# Patient Record
Sex: Male | Born: 1980
Health system: Southern US, Community
[De-identification: ages and names within clinical notes are randomized; demographics above are authoritative.]

---

## 2019-10-11 ENCOUNTER — Ambulatory Visit (INDEPENDENT_AMBULATORY_CARE_PROVIDER_SITE_OTHER): Payer: BC Managed Care – PPO | Admitting: Sports Medicine

## 2019-10-11 ENCOUNTER — Ambulatory Visit (INDEPENDENT_AMBULATORY_CARE_PROVIDER_SITE_OTHER): Payer: BC Managed Care – PPO

## 2019-10-11 ENCOUNTER — Encounter: Payer: Self-pay | Admitting: Sports Medicine

## 2019-10-11 ENCOUNTER — Other Ambulatory Visit: Payer: Self-pay

## 2019-10-11 DIAGNOSIS — M5416 Radiculopathy, lumbar region: Secondary | ICD-10-CM | POA: Diagnosis not present

## 2019-10-11 MED ORDER — KETOROLAC TROMETHAMINE 60 MG/2ML IM SOLN
60.0000 mg | Freq: Once | INTRAMUSCULAR | Status: AC
  Administered 2019-10-11: 60 mg via INTRAMUSCULAR

## 2019-10-11 MED ORDER — PREDNISONE 50 MG PO TABS: ORAL_TABLET | ORAL | 0 refills | Status: DC

## 2019-10-11 MED ORDER — CYCLOBENZAPRINE HCL 10 MG PO TABS: ORAL_TABLET | ORAL | 0 refills | Status: DC

## 2019-10-11 MED ORDER — IBUPROFEN 800 MG PO TABS: 800.0000 mg | ORAL_TABLET | Freq: Three times a day (TID) | ORAL | 2 refills | Status: DC | PRN

## 2019-10-11 MED ORDER — HYDROCODONE-ACETAMINOPHEN 10-325 MG PO TABS: 1.0000 | ORAL_TABLET | Freq: Three times a day (TID) | ORAL | 0 refills | Status: DC | PRN

## 2019-10-11 NOTE — Addendum Note (Signed)
Addended by: Monica Becton on: 10/11/2019 02:16 PM   Modules accepted: Orders

## 2019-10-11 NOTE — Assessment & Plan Note (Signed)
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 No bowel or bladder dysfunction, saddle numbness, progressive weakness. This is consistent with lumbar radiculitis with axial discogenic pain as well. We discussed the anatomy as well as evolutionary anthropology of degenerative disc disease. He got a shot of Toradol intramuscular today as he was having significant pain. Adding prednisone, ibuprofen prescription strength, low-dose of hydrocodone, formal physical therapy and x-rays. Return to see me in 1 month.

## 2019-10-11 NOTE — Addendum Note (Signed)
Addended by: Ardyth Man on: 10/11/2019 02:35 PM   Modules accepted: Orders

## 2019-10-11 NOTE — Progress Notes (Signed)
    Procedures performed today:    None.  Independent interpretation of notes and tests performed by another provider:   None.  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 No bowel or bladder dysfunction, saddle numbness, progressive weakness. This is consistent with lumbar radiculitis with axial discogenic pain as well. We discussed the anatomy as well as evolutionary anthropology of degenerative disc disease. He got a shot of Toradol intramuscular today as he was having significant pain. Adding prednisone, ibuprofen prescription strength, low-dose of hydrocodone, formal physical therapy and x-rays. Return to see me in 1 month.    ___________________________________________ Ihor Austin. Benjamin Stain, M.D., ABFM., CAQSM. Primary Care and Sports Medicine [facility]  Adjunct Instructor of Family Medicine  [employer]

## 2019-10-26 ENCOUNTER — Ambulatory Visit: Payer: BC Managed Care – PPO | Admitting: Rehabilitative and Restorative Service Providers"

## 2019-11-08 ENCOUNTER — Other Ambulatory Visit: Payer: Self-pay

## 2019-11-08 ENCOUNTER — Ambulatory Visit (INDEPENDENT_AMBULATORY_CARE_PROVIDER_SITE_OTHER): Payer: BC Managed Care – PPO | Admitting: Sports Medicine

## 2019-11-08 ENCOUNTER — Encounter: Payer: Self-pay | Admitting: Sports Medicine

## 2019-11-08 DIAGNOSIS — M5416 Radiculopathy, lumbar region: Secondary | ICD-10-CM | POA: Diagnosis not present

## 2019-11-08 MED ORDER — MELOXICAM 15 MG PO TABS: ORAL_TABLET | ORAL | 3 refills | Status: DC

## 2019-11-08 MED ORDER — HYDROCODONE-ACETAMINOPHEN 10-325 MG PO TABS: 1.0000 | ORAL_TABLET | Freq: Three times a day (TID) | ORAL | 0 refills | Status: DC | PRN

## 2019-11-08 NOTE — Progress Notes (Signed)
    Procedures performed today:    None.  Independent interpretation of notes and tests performed by another provider:   None.  Brief History, Exam, Impression, and Recommendations:    Left lumbar radiculitis This pleasant 39 year old male returns, he had left lumbar radiculitis, x-rays at the time showed L5-S1 DDD. We treated him conservatively and symptoms improved considerably, still has occasional paresthesias down the left leg, and occasional axial pain, we discussed activity modification and lifestyle habits that can decrease this. At this point symptoms are not severe enough to proceed with MRI or epidural injection or surgical referral. Switching from ibuprofen to meloxicam to be taken as needed, return to see me as needed, he will establish care with Dr. Everrett Coombe.    ___________________________________________ Ihor Austin. Benjamin Stain, M.D., ABFM., CAQSM. Primary Care and Sports Medicine [facility]  Adjunct Instructor of Family Medicine  [employer]

## 2019-11-08 NOTE — Assessment & Plan Note (Signed)
This pleasant 39 year old male returns, he had left lumbar radiculitis, x-rays at the time showed L5-S1 DDD. We treated him conservatively and symptoms improved considerably, still has occasional paresthesias down the left leg, and occasional axial pain, we discussed activity modification and lifestyle habits that can decrease this. At this point symptoms are not severe enough to proceed with MRI or epidural injection or surgical referral. Switching from ibuprofen to meloxicam to be taken as needed, return to see me as needed, he will establish care with Dr. Everrett Coombe.

## 2020-03-25 IMAGING — DX DG LUMBAR SPINE COMPLETE 4+V
5 series · 5 of 5 positions shown · non-contrast
Comparison: None.

CLINICAL DATA: Lower back pain.

EXAM:
LUMBAR SPINE - COMPLETE 4+ VIEW

[l-spine ap]
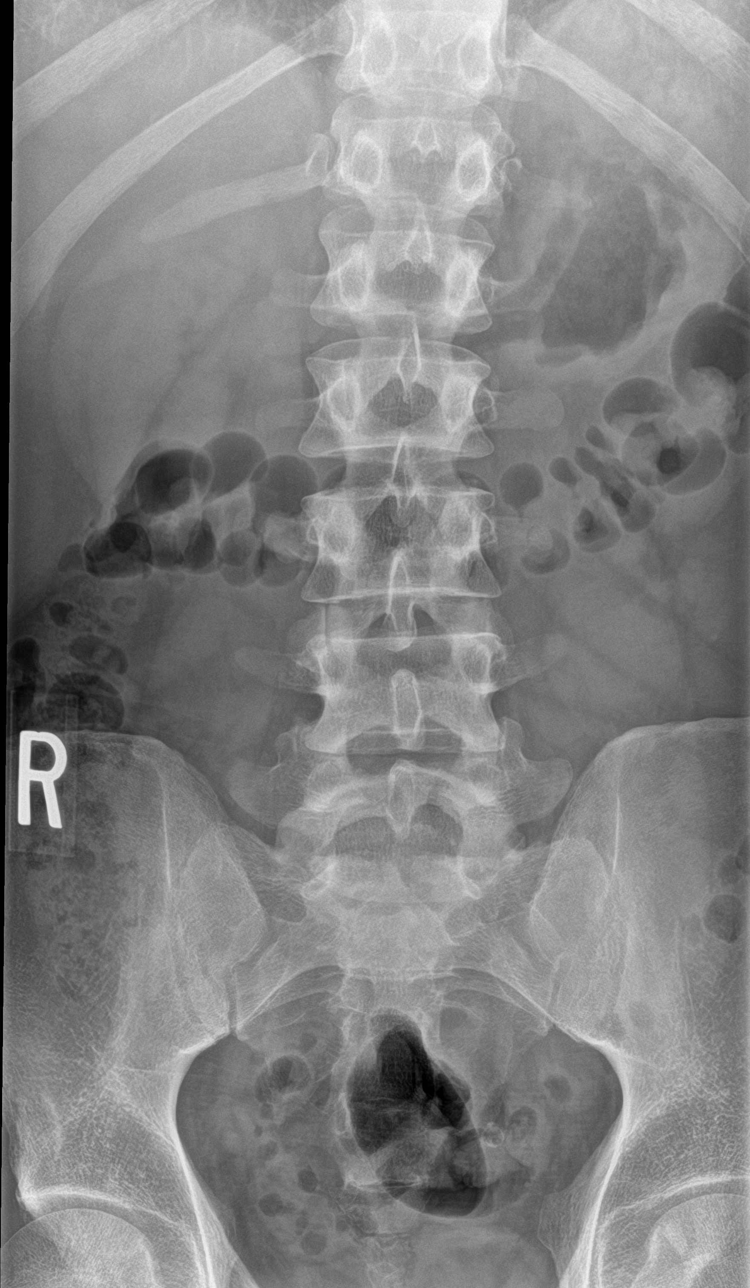

[l-spine obl (1 of 2)]
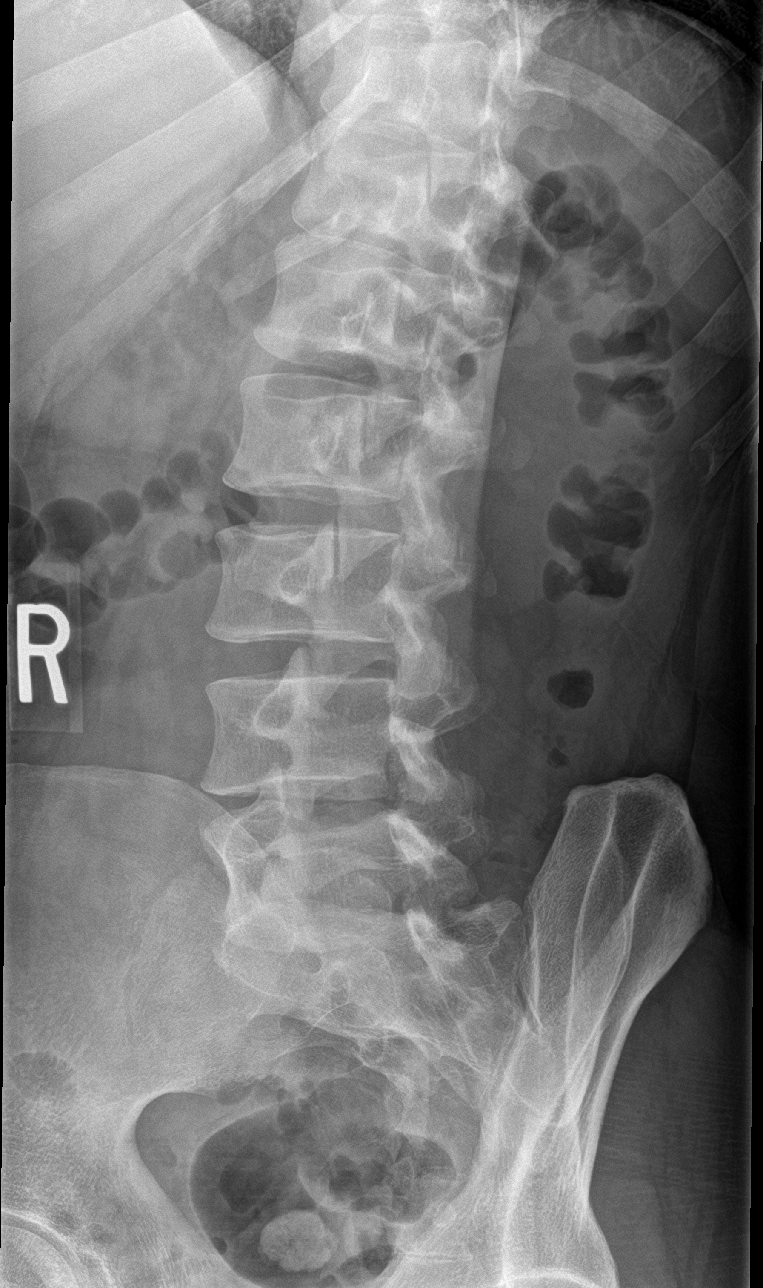

[l-spine obl (2 of 2)]
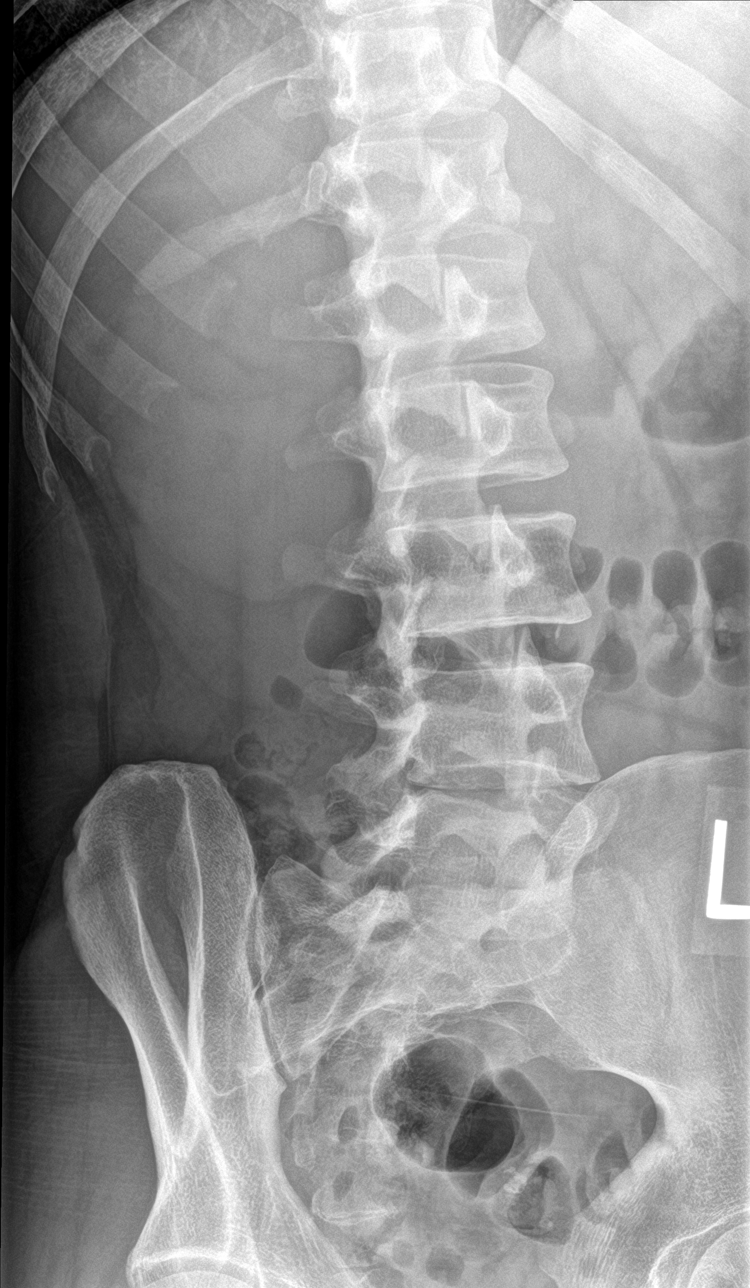

[l-spine lat]
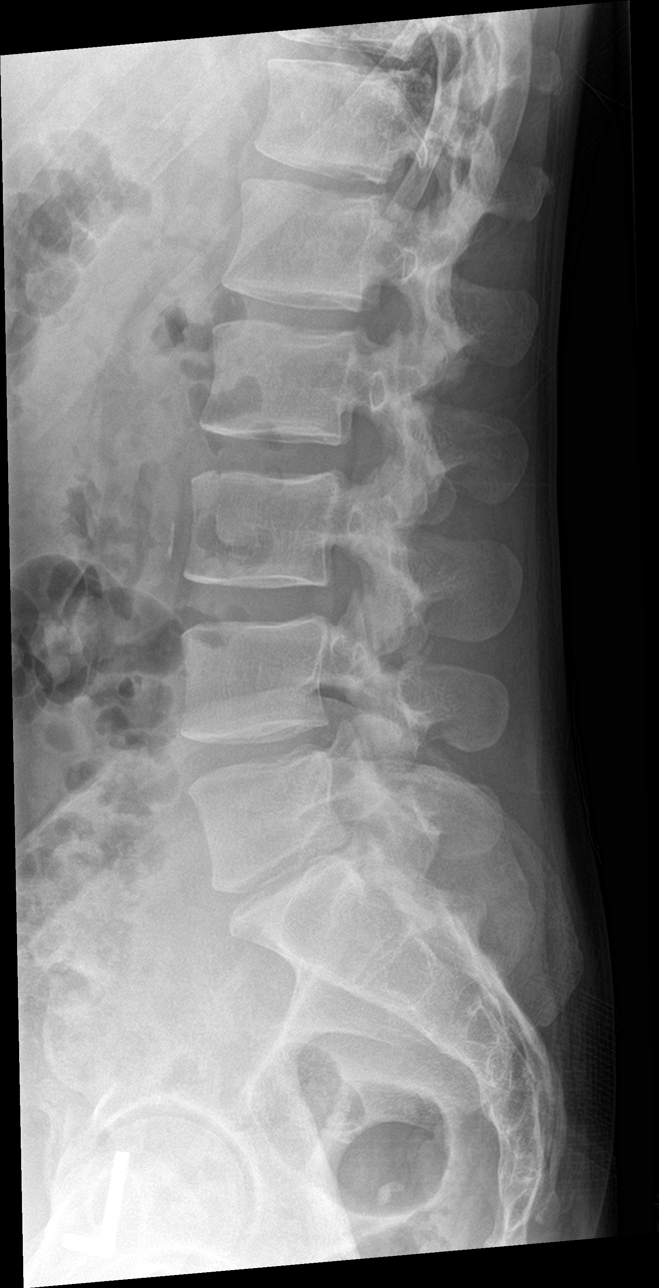

[l-spine spot]
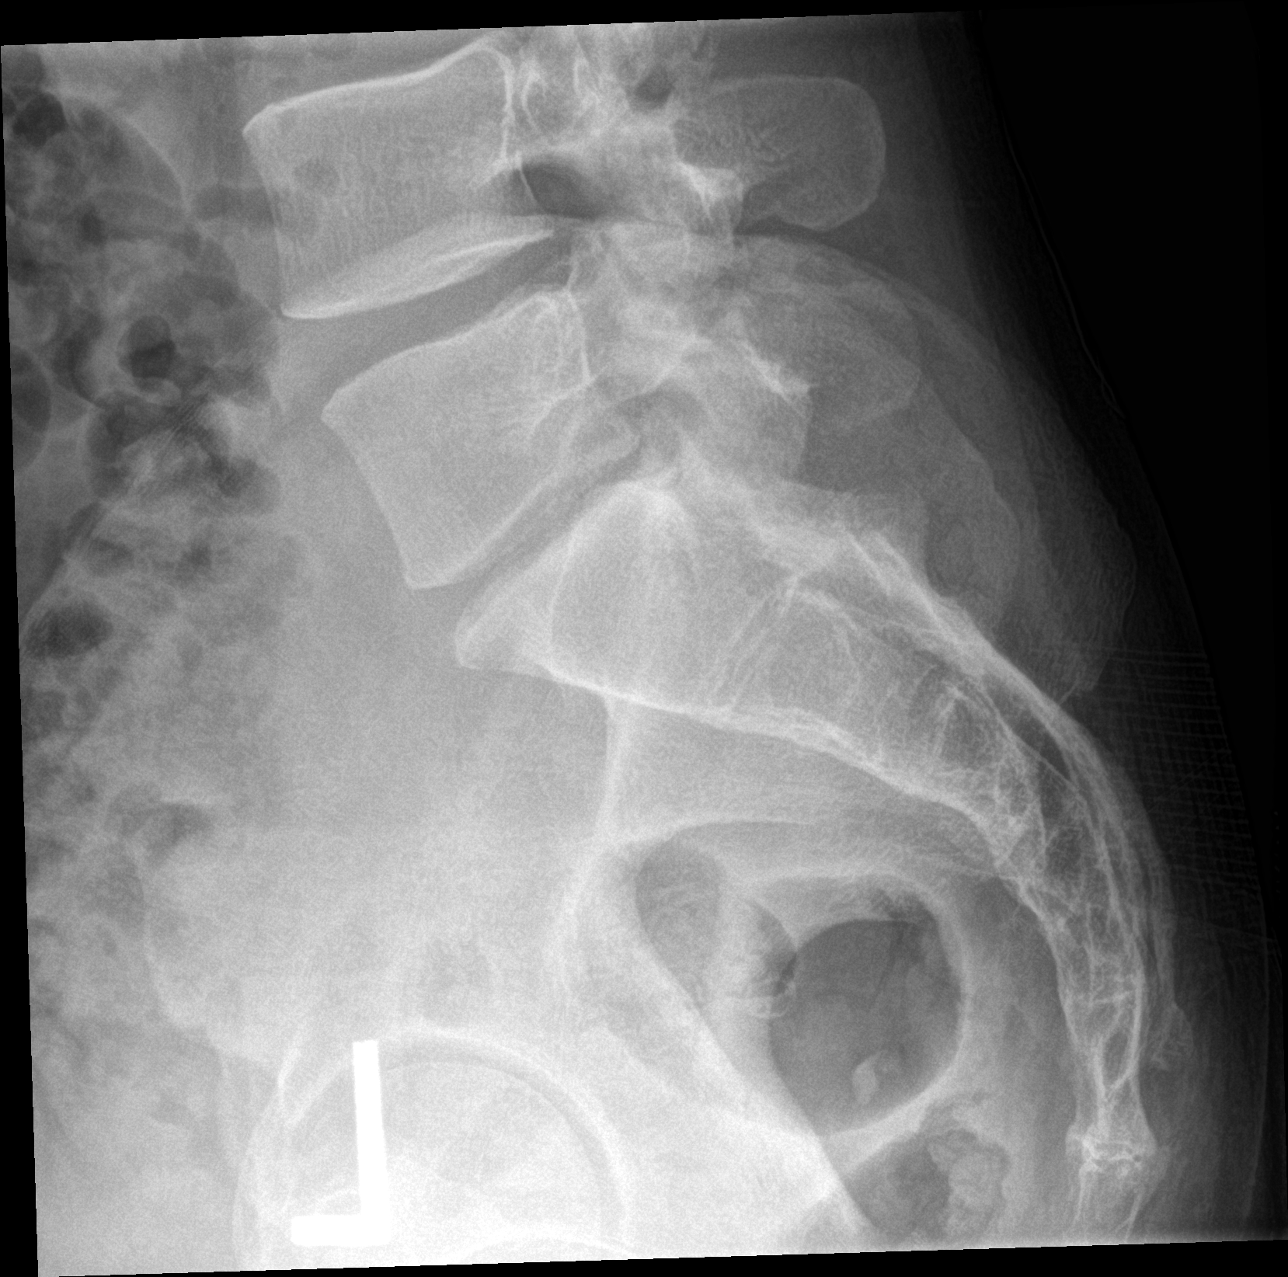

[5 of 5 positions shown; findings below may reference images not displayed]

FINDINGS: There is no evidence of lumbar spine fracture. Alignment is normal.
Normal endplates are seen throughout the lumbar spine. Mild
intervertebral disc space narrowing is seen at the level of L5-S1.
IMPRESSION: Mild intervertebral disc space narrowing at L5-S1.

## 2020-07-22 DIAGNOSIS — Z8616 Personal history of COVID-19: Secondary | ICD-10-CM

## 2020-07-22 HISTORY — DX: Personal history of COVID-19: Z86.16

## 2020-10-27 ENCOUNTER — Encounter: Payer: Self-pay | Admitting: Family Medicine

## 2020-10-27 ENCOUNTER — Other Ambulatory Visit: Payer: Self-pay

## 2020-10-27 ENCOUNTER — Ambulatory Visit: Payer: BC Managed Care – PPO | Admitting: Family Medicine

## 2020-10-27 DIAGNOSIS — J309 Allergic rhinitis, unspecified: Secondary | ICD-10-CM | POA: Diagnosis not present

## 2020-10-27 DIAGNOSIS — F17210 Nicotine dependence, cigarettes, uncomplicated: Secondary | ICD-10-CM

## 2020-10-27 DIAGNOSIS — M5416 Radiculopathy, lumbar region: Secondary | ICD-10-CM | POA: Diagnosis not present

## 2020-10-27 DIAGNOSIS — F411 Generalized anxiety disorder: Secondary | ICD-10-CM | POA: Diagnosis not present

## 2020-10-27 MED ORDER — MELOXICAM 15 MG PO TABS: ORAL_TABLET | ORAL | 3 refills | Status: DC

## 2020-10-27 NOTE — Patient Instructions (Signed)
Great to meet you!  Try adding cetirizine and flonase daily for sinus issues.   Let me know if you would like to try Chantix to quit smoking.   If you decide you want to try medication or counseling for anxiety let me know.

## 2020-10-29 ENCOUNTER — Encounter: Payer: Self-pay | Admitting: Family Medicine

## 2020-10-29 DIAGNOSIS — J309 Allergic rhinitis, unspecified: Secondary | ICD-10-CM | POA: Insufficient documentation

## 2020-10-29 DIAGNOSIS — F411 Generalized anxiety disorder: Secondary | ICD-10-CM | POA: Insufficient documentation

## 2020-10-29 DIAGNOSIS — F17200 Nicotine dependence, unspecified, uncomplicated: Secondary | ICD-10-CM | POA: Insufficient documentation

## 2020-10-29 NOTE — Assessment & Plan Note (Signed)
Discussed options for management including medication, counseling, app based relaxation programs.  He will let me know if he decides to pursue any of these.

## 2020-10-29 NOTE — Assessment & Plan Note (Signed)
Counseled on smoking cessation. He will let me know if interested in Chatix.  Side effects and blackbox warning discussed.

## 2020-10-29 NOTE — Progress Notes (Signed)
Manuel Weaver - 40 y.o. male MRN [id number]  Date of birth: 07-09-1981  Subjective Chief Complaint  Patient presents with  . Establish Care    HPI CLEARENCE [name] is a 40 y.o. male here today for initial visit.  He has been in pretty good health as far as he knows but hasn't had a regular primary care provider in several years.   He has been seeing Dr. Benjamin Stain for lumbar radiculitis previously.  He is taking meloxicam as needed for this.  He is requesting a refill of this today.    He is a current smoker and is interested in quitting.  He tried patches in the past without much luck.  He may be intertested in trying chantix.    He has had some issues with sinus congestion and ear pressure.  He has not tried anything so far for this.  He denies sins pain or fever.   He has had some issues with anxiety.  He isn't really interested in medication for this. He is unsure if he wants to pursue counseling at this time.    Depression screen [facility] 2/9 10/27/2020  Decreased Interest 0  Down, Depressed, Hopeless 0  PHQ - 2 Score 0  Altered sleeping 0  Tired, decreased energy 1  Change in appetite 1  Feeling bad or failure about yourself  0  Trouble concentrating 0  Moving slowly or fidgety/restless 0  Suicidal thoughts 0  PHQ-9 Score 2   GAD 7 : Generalized Anxiety Score 10/27/2020  Nervous, Anxious, on Edge 3  Control/stop worrying 3  Worry too much - different things 3  Trouble relaxing 3  Restless 3  Easily annoyed or irritable 3  Afraid - awful might happen 3  Total GAD 7 Score 21  Anxiety Difficulty Somewhat difficult     ROS:  A comprehensive ROS was completed and negative except as noted per HPI  No Known Allergies  Past Medical History:  Diagnosis Date  . History of COVID-19 07/22/2020    History reviewed. No pertinent surgical history.  Social History   Socioeconomic History  . Marital status: Married    Spouse name: Not on file  . Number of  children: 1  . Years of education: Not on file  . Highest education level: Not on file  Occupational History  . Not on file  Tobacco Use  . Smoking status: Current Every Day Smoker    Packs/day: 0.75    Years: 15.00    Pack years: 11.25    Types: Cigarettes  . Smokeless tobacco: Never Used  Vaping Use  . Vaping Use: Never used  Substance and Sexual Activity  . Alcohol use: Yes    Comment: socially  . Drug use: Never  . Sexual activity: Yes    Partners: Female  Other Topics Concern  . Not on file  Social History Narrative  . Not on file   Social Determinants of Health   Financial Resource Strain: Not on file  Food Insecurity: Not on file  Transportation Needs: Not on file  Physical Activity: Not on file  Stress: Not on file  Social Connections: Not on file    Family History  Problem Relation Age of Onset  . Skin cancer Mother   . Hypertension Father   . Stroke Maternal Grandfather     Health Maintenance  Topic Date Due  . Hepatitis C Screening  Never done  . HIV Screening  Never done  . COVID-19 Vaccine (  3 - Booster for Moderna series) 03/22/2021 (Originally 04/25/2020)  . INFLUENZA VACCINE  02/19/2021  . TETANUS/TDAP  12/21/2027  . HPV VACCINES  Aged Out     ----------------------------------------------------------------------------------------------------------------------------------------------------------------------------------------------------------------- Physical Exam BP (!) 132/93 (BP Location: Left Arm, Patient Position: Sitting, Cuff Size: Normal)   Pulse 80   Temp 97.7 F (36.5 C)   Ht 5\' 11"  (1.803 m)   Wt 174 lb 12.8 oz (79.3 kg)   SpO2 100%   BMI 24.38 kg/m   Physical Exam Constitutional:      Appearance: Normal appearance.  HENT:     Head: Normocephalic and atraumatic.     Right Ear: Tympanic membrane and ear canal normal.     Left Ear: Tympanic membrane and ear canal normal.  Eyes:     General: No scleral  icterus. Cardiovascular:     Rate and Rhythm: Normal rate and regular rhythm.  Pulmonary:     Effort: Pulmonary effort is normal.     Breath sounds: Normal breath sounds.  Musculoskeletal:     Cervical back: Neck supple.  Neurological:     General: No focal deficit present.     Mental Status: He is alert.  Psychiatric:        Mood and Affect: Mood normal.        Behavior: Behavior normal.     ------------------------------------------------------------------------------------------------------------------------------------------------------------------------------------------------------------------- Assessment and Plan  Allergic rhinitis He can try combination of antihistamine and flonase to help with symptoms.  He does have some mild eustachian tube dysfunction as well and I think management of his allergies may help some with this as well.   GAD (generalized anxiety disorder) Discussed options for management including medication, counseling, app based relaxation programs.  He will let me know if he decides to pursue any of these.    Left lumbar radiculitis Rx for meloxicam renewed.    Nicotine dependence Counseled on smoking cessation. He will let me know if interested in Chatix.  Side effects and blackbox warning discussed.     Meds ordered this encounter  Medications  . meloxicam (MOBIC) 15 MG tablet    Sig: One tab PO qAM with a meal for 2 weeks, then daily prn pain.    Dispense:  30 tablet    Refill:  3    No follow-ups on file.    This visit occurred during the SARS-CoV-2 public health emergency.  Safety protocols were in place, including screening questions prior to the visit, additional usage of staff PPE, and extensive cleaning of exam room while observing appropriate contact time as indicated for disinfecting solutions.

## 2020-10-29 NOTE — Assessment & Plan Note (Signed)
He can try combination of antihistamine and flonase to help with symptoms.  He does have some mild eustachian tube dysfunction as well and I think management of his allergies may help some with this as well.

## 2020-10-29 NOTE — Assessment & Plan Note (Signed)
Rx for meloxicam renewed.

## 2020-11-15 ENCOUNTER — Encounter: Payer: Self-pay | Admitting: Family Medicine

## 2020-11-15 ENCOUNTER — Other Ambulatory Visit: Payer: Self-pay

## 2020-11-15 ENCOUNTER — Ambulatory Visit (INDEPENDENT_AMBULATORY_CARE_PROVIDER_SITE_OTHER): Payer: BC Managed Care – PPO | Admitting: Family Medicine

## 2020-11-15 VITALS — BP 121/80 | HR 121 | Temp 97.6°F | Ht 71.0 in | Wt 174.4 lb

## 2020-11-15 DIAGNOSIS — Z0000 Encounter for general adult medical examination without abnormal findings: Secondary | ICD-10-CM | POA: Diagnosis not present

## 2020-11-15 DIAGNOSIS — Z13220 Encounter for screening for lipoid disorders: Secondary | ICD-10-CM | POA: Diagnosis not present

## 2020-11-15 DIAGNOSIS — M5416 Radiculopathy, lumbar region: Secondary | ICD-10-CM

## 2020-11-15 DIAGNOSIS — F17210 Nicotine dependence, cigarettes, uncomplicated: Secondary | ICD-10-CM | POA: Diagnosis not present

## 2020-11-15 MED ORDER — HYDROCODONE-ACETAMINOPHEN 10-325 MG PO TABS: 1.0000 | ORAL_TABLET | Freq: Three times a day (TID) | ORAL | 0 refills | Status: DC | PRN

## 2020-11-15 MED ORDER — CHANTIX STARTING MONTH PAK 0.5 MG X 11 & 1 MG X 42 PO TABS: ORAL_TABLET | ORAL | 0 refills | Status: DC

## 2020-11-15 MED ORDER — MELOXICAM 15 MG PO TABS
15.0000 mg | ORAL_TABLET | Freq: Every day | ORAL | 3 refills | Status: DC | PRN
Start: 2020-11-15 — End: 2021-12-07

## 2020-11-15 NOTE — Progress Notes (Signed)
Manuel Weaver - 40 y.o. male MRN [id number]  Date of birth: 03-11-1981  Subjective Chief Complaint  Patient presents with  . Annual Exam    HPI Manuel Weaver is  40 y.o. male here today for annual exam.    He is interested in quitting smoking.  He has tried patches in the past without success.  He would like to try Chantix.    He would like refill of meloxicam and a few hydrocodone to keep on hand for lumbar radiculopathy.    He continues to have some anxiety but remains undecided about starting medication or seeing a therapist for this.   He feels like diet isn't great.  He typically does not eat during the day and is only eating in the evening.  He does drink quite a bit of coffee.    He is up to date on Tetanus vaccine.   Review of Systems  Constitutional: Negative for chills, fever, malaise/fatigue and weight loss.  HENT: Negative for congestion, ear pain and sore throat.   Eyes: Negative for blurred vision, double vision and pain.  Respiratory: Negative for cough and shortness of breath.   Cardiovascular: Negative for chest pain and palpitations.  Gastrointestinal: Negative for abdominal pain, blood in stool, constipation, heartburn and nausea.  Genitourinary: Negative for dysuria and urgency.  Musculoskeletal: Negative for joint pain and myalgias.  Neurological: Negative for dizziness and headaches.  Endo/Heme/Allergies: Does not bruise/bleed easily.  Psychiatric/Behavioral: Negative for depression. The patient is not nervous/anxious and does not have insomnia.     No Known Allergies  Past Medical History:  Diagnosis Date  . History of COVID-19 07/22/2020    History reviewed. No pertinent surgical history.  Social History   Socioeconomic History  . Marital status: Married    Spouse name: Not on file  . Number of children: 1  . Years of education: Not on file  . Highest education level: Not on file  Occupational History  . Not on file   Tobacco Use  . Smoking status: Current Every Day Smoker    Packs/day: 0.75    Years: 15.00    Pack years: 11.25    Types: Cigarettes  . Smokeless tobacco: Never Used  Vaping Use  . Vaping Use: Never used  Substance and Sexual Activity  . Alcohol use: Yes    Comment: socially  . Drug use: Never  . Sexual activity: Yes    Partners: Female  Other Topics Concern  . Not on file  Social History Narrative  . Not on file   Social Determinants of Health   Financial Resource Strain: Not on file  Food Insecurity: Not on file  Transportation Needs: Not on file  Physical Activity: Not on file  Stress: Not on file  Social Connections: Not on file    Family History  Problem Relation Age of Onset  . Skin cancer Mother   . Hypertension Father   . Stroke Maternal Grandfather     Health Maintenance  Topic Date Due  . Hepatitis C Screening  Never done  . HIV Screening  Never done  . COVID-19 Vaccine (3 - Booster for Moderna series) 03/22/2021 (Originally 04/25/2020)  . INFLUENZA VACCINE  02/19/2021  . TETANUS/TDAP  12/21/2027  . HPV VACCINES  Aged Out     ----------------------------------------------------------------------------------------------------------------------------------------------------------------------------------------------------------------- Physical Exam BP 121/80 (BP Location: Left Arm, Patient Position: Sitting, Cuff Size: Normal)   Pulse (!) 121   Temp 97.6 F (36.4 C)   Ht 5'  11" (1.803 m)   Wt 174 lb 6.4 oz (79.1 kg)   SpO2 100%   BMI 24.32 kg/m   Physical Exam Constitutional:      General: He is not in acute distress. HENT:     Head: Normocephalic and atraumatic.     Right Ear: Tympanic membrane and external ear normal.     Left Ear: Tympanic membrane and external ear normal.  Eyes:     General: No scleral icterus. Neck:     Thyroid: No thyromegaly.  Cardiovascular:     Rate and Rhythm: Normal rate and regular rhythm.     Heart sounds:  Normal heart sounds.  Pulmonary:     Effort: Pulmonary effort is normal.     Breath sounds: Normal breath sounds.  Abdominal:     General: Bowel sounds are normal. There is no distension.     Palpations: Abdomen is soft.     Tenderness: There is no abdominal tenderness. There is no guarding.  Musculoskeletal:     Cervical back: Normal range of motion.  Lymphadenopathy:     Cervical: No cervical adenopathy.  Skin:    General: Skin is warm and dry.     Findings: No rash.  Neurological:     Mental Status: He is alert and oriented to person, place, and time.     Cranial Nerves: No cranial nerve deficit.     Motor: No abnormal muscle tone.  Psychiatric:        Behavior: Behavior normal.     ------------------------------------------------------------------------------------------------------------------------------------------------------------------------------------------------------------------- Assessment and Plan  Nicotine dependence Start chantix, side effects and boxed warnings discussed and he expresses understanding.    Left lumbar radiculitis Meloxicam renewed. #15 norco provided as needed for sever pain. PDMP reviewed.  If needing more than this we'll need to get him on an Opoid contract and check UDS.    Well adult exam Well adult Orders Placed This Encounter  Procedures  . COMPLETE METABOLIC PANEL WITH GFR  . CBC with Differential  . Lipid Panel w/reflex Direct LDL  Screening: lipid panel Immunizations: UTD Anticipatory guidance/Risk factor reduction:  Recommendations per AVS.  Working on quitting smoking.    Meds ordered this encounter  Medications  . meloxicam (MOBIC) 15 MG tablet    Sig: Take 1 tablet (15 mg total) by mouth daily as needed for pain. One tab PO qAM with a meal for 2 weeks, then daily prn pain.    Dispense:  30 tablet    Refill:  3  . HYDROcodone-acetaminophen (NORCO) 10-325 MG tablet    Sig: Take 1 tablet by mouth every 8 (eight) hours  as needed.    Dispense:  15 tablet    Refill:  0  . varenicline (CHANTIX STARTING MONTH PAK) 0.5 MG X 11 & 1 MG X 42 tablet    Sig: Take one 0.5 mg tablet by mouth once daily for 3 days, then increase to one 0.5 mg tablet twice daily for 4 days, then increase to one 1 mg tablet twice daily.    Dispense:  53 tablet    Refill:  0    No follow-ups on file.    This visit occurred during the SARS-CoV-2 public health emergency.  Safety protocols were in place, including screening questions prior to the visit, additional usage of staff PPE, and extensive cleaning of exam room while observing appropriate contact time as indicated for disinfecting solutions.

## 2020-11-15 NOTE — Assessment & Plan Note (Signed)
Meloxicam renewed. #15 norco provided as needed for sever pain. PDMP reviewed.  If needing more than this we'll need to get him on an Opoid contract and check UDS.

## 2020-11-15 NOTE — Assessment & Plan Note (Signed)
Well adult Orders Placed This Encounter  Procedures  . COMPLETE METABOLIC PANEL WITH GFR  . CBC with Differential  . Lipid Panel w/reflex Direct LDL  Screening: lipid panel Immunizations: UTD Anticipatory guidance/Risk factor reduction:  Recommendations per AVS.  Working on quitting smoking.

## 2020-11-15 NOTE — Assessment & Plan Note (Signed)
Start chantix, side effects and boxed warnings discussed and he expresses understanding.

## 2020-11-15 NOTE — Patient Instructions (Signed)

## 2020-11-16 LAB — CBC WITH DIFFERENTIAL/PLATELET
Absolute Monocytes: 494 cells/uL (ref 200–950)
Basophils Absolute: 67 cells/uL (ref 0–200)
Basophils Relative: 0.7 %
Eosinophils Absolute: 371 cells/uL (ref 15–500)
Eosinophils Relative: 3.9 %
HCT: 50.8 % — ABNORMAL HIGH (ref 38.5–50.0)
Hemoglobin: 17.7 g/dL — ABNORMAL HIGH (ref 13.2–17.1)
Lymphs Abs: 2138 cells/uL (ref 850–3900)
MCH: 32.5 pg (ref 27.0–33.0)
MCHC: 34.8 g/dL (ref 32.0–36.0)
MCV: 93.4 fL (ref 80.0–100.0)
MPV: 11.3 fL (ref 7.5–12.5)
Monocytes Relative: 5.2 %
Neutro Abs: 6432 cells/uL (ref 1500–7800)
Neutrophils Relative %: 67.7 %
Platelets: 208 10*3/uL (ref 140–400)
RBC: 5.44 10*6/uL (ref 4.20–5.80)
RDW: 12.5 % (ref 11.0–15.0)
Total Lymphocyte: 22.5 %
WBC: 9.5 10*3/uL (ref 3.8–10.8)

## 2020-11-16 LAB — COMPLETE METABOLIC PANEL WITH GFR
AG Ratio: 2.4 (calc) (ref 1.0–2.5)
ALT: 11 U/L (ref 9–46)
AST: 13 U/L (ref 10–40)
Albumin: 5 g/dL (ref 3.6–5.1)
Alkaline phosphatase (APISO): 84 U/L (ref 36–130)
BUN: 13 mg/dL (ref 7–25)
CO2: 30 mmol/L (ref 20–32)
Calcium: 10.2 mg/dL (ref 8.6–10.3)
Chloride: 104 mmol/L (ref 98–110)
Creat: 1.2 mg/dL (ref 0.60–1.35)
GFR, Est African American: 87 mL/min/{1.73_m2} (ref >=60)
GFR, Est Non African American: 75 mL/min/{1.73_m2} (ref >=60)
Globulin: 2.1 g/dL (calc) (ref 1.9–3.7)
Glucose, Bld: 92 mg/dL (ref 65–99)
Potassium: 5.5 mmol/L — ABNORMAL HIGH (ref 3.5–5.3)
Sodium: 141 mmol/L (ref 135–146)
Total Bilirubin: 1 mg/dL (ref 0.2–1.2)
Total Protein: 7.1 g/dL (ref 6.1–8.1)

## 2020-11-16 LAB — LIPID PANEL W/REFLEX DIRECT LDL
Cholesterol: 199 mg/dL (ref <200)
HDL: 64 mg/dL (ref >=40)
LDL Cholesterol (Calc): 121 mg/dL (calc) — ABNORMAL HIGH
Non-HDL Cholesterol (Calc): 135 mg/dL (calc) — ABNORMAL HIGH (ref <130)
Total CHOL/HDL Ratio: 3.1 (calc) (ref <5.0)
Triglycerides: 57 mg/dL (ref <150)

## 2020-11-30 ENCOUNTER — Other Ambulatory Visit: Payer: Self-pay

## 2020-11-30 MED ORDER — CHANTIX STARTING MONTH PAK 0.5 MG X 11 & 1 MG X 42 PO TABS: ORAL_TABLET | ORAL | 0 refills | Status: DC

## 2020-11-30 NOTE — Telephone Encounter (Signed)
Patient's spouse called to advise that [facility] has a backlog on Chantix. They suggested the Rx be transferred to [facility].   Resent Rx to [facility]

## 2021-01-30 ENCOUNTER — Ambulatory Visit (INDEPENDENT_AMBULATORY_CARE_PROVIDER_SITE_OTHER): Payer: BC Managed Care – PPO | Admitting: Family Medicine

## 2021-01-30 ENCOUNTER — Other Ambulatory Visit: Payer: Self-pay

## 2021-01-30 ENCOUNTER — Encounter: Payer: Self-pay | Admitting: Family Medicine

## 2021-01-30 DIAGNOSIS — M5416 Radiculopathy, lumbar region: Secondary | ICD-10-CM | POA: Diagnosis not present

## 2021-01-30 MED ORDER — KETOROLAC TROMETHAMINE 60 MG/2ML IM SOLN
60.0000 mg | Freq: Once | INTRAMUSCULAR | Status: AC
  Administered 2021-01-30: 60 mg via INTRAMUSCULAR

## 2021-01-30 MED ORDER — PREDNISONE 50 MG PO TABS: ORAL_TABLET | ORAL | 0 refills | Status: DC

## 2021-01-30 MED ORDER — HYDROCODONE-ACETAMINOPHEN 10-325 MG PO TABS: 1.0000 | ORAL_TABLET | Freq: Three times a day (TID) | ORAL | 0 refills | Status: DC | PRN

## 2021-01-30 NOTE — Patient Instructions (Signed)
You received a shot of toradol today.  This is a potent anti-inflammatory medicine.  Start prednisone daily for 5 days.  Use pain medication sparingly if needed.  Let me know if not improving with this and we'll get you set up with formal physical therapy.

## 2021-01-30 NOTE — Progress Notes (Signed)
Manuel Weaver - 40 y.o. male MRN [id number]  Date of birth: 01-19-1981  Subjective Chief Complaint  Patient presents with   Back Pain    HPI Manuel Weaver is a 40 year old male here today with complaint of lower back pain with radiation into the left leg.  He has had previous episode of lumbar radiculopathy about 1 year ago.  He was seen by Dr. Benjamin Stain and had Toradol injection as well as prescription for hydrocodone and muscle relaxer.  These did seem to improve his symptoms.  His current symptoms started about 3 to 4 days ago after playing golf.  Denies any weakness, numbness, tingling associated with this.  ROS:  A comprehensive ROS was completed and negative except as noted per HPI  No Known Allergies  Past Medical History:  Diagnosis Date   History of COVID-19 07/22/2020    History reviewed. No pertinent surgical history.  Social History   Socioeconomic History   Marital status: Married    Spouse name: Not on file   Number of children: 1   Years of education: Not on file   Highest education level: Not on file  Occupational History   Not on file  Tobacco Use   Smoking status: Every Day    Packs/day: 0.75    Years: 15.00    Pack years: 11.25    Types: Cigarettes   Smokeless tobacco: Never  Vaping Use   Vaping Use: Never used  Substance and Sexual Activity   Alcohol use: Yes    Comment: socially   Drug use: Never   Sexual activity: Yes    Partners: Female  Other Topics Concern   Not on file  Social History Narrative   Not on file   Social Determinants of Health   Financial Resource Strain: Not on file  Food Insecurity: Not on file  Transportation Needs: Not on file  Physical Activity: Not on file  Stress: Not on file  Social Connections: Not on file    Family History  Problem Relation Age of Onset   Skin cancer Mother    Hypertension Father    Stroke Maternal Grandfather     Health Maintenance  Topic Date Due   Pneumococcal Vaccine  81-66 Years old (1 - PCV) Never done   HIV Screening  Never done   Hepatitis C Screening  Never done   COVID-19 Vaccine (3 - Booster for Moderna series) 03/22/2021 (Originally 03/26/2020)   INFLUENZA VACCINE  02/19/2021   TETANUS/TDAP  12/21/2027   HPV VACCINES  Aged Out     ----------------------------------------------------------------------------------------------------------------------------------------------------------------------------------------------------------------- Physical Exam BP 108/73 (BP Location: Left Arm, Patient Position: Sitting, Cuff Size: Normal)   Pulse (!) 57   Ht 5\' 11"  (1.803 m)   Wt 178 lb (80.7 kg)   SpO2 100%   BMI 24.83 kg/m   Physical Exam Constitutional:      Appearance: Normal appearance.  Musculoskeletal:     Comments: Stiff posture with slightly antalgic gait.  Neurological:     General: No focal deficit present.     Mental Status: He is alert and oriented to person, place, and time.  Psychiatric:        Mood and Affect: Mood normal.        Behavior: Behavior normal.    ------------------------------------------------------------------------------------------------------------------------------------------------------------------------------------------------------------------- Assessment and Plan  Left lumbar radiculitis He was given injection of Toradol today.  Started course of prednisone 50 mg x 5 days.  I did renew his hydrocodone for short-term use.  We discussed that  if not improving with this I would recommend addition of formal physical therapy.  We can consider MRI as well if needed for interventional planning.   Meds ordered this encounter  Medications   predniSONE (DELTASONE) 50 MG tablet    Sig: Take 1 tab PO daily    Dispense:  5 tablet    Refill:  0   HYDROcodone-acetaminophen (NORCO) 10-325 MG tablet    Sig: Take 1 tablet by mouth every 8 (eight) hours as needed.    Dispense:  15 tablet    Refill:  0   ketorolac  (TORADOL) injection 60 mg    No follow-ups on file.    This visit occurred during the SARS-CoV-2 public health emergency.  Safety protocols were in place, including screening questions prior to the visit, additional usage of staff PPE, and extensive cleaning of exam room while observing appropriate contact time as indicated for disinfecting solutions.

## 2021-01-30 NOTE — Assessment & Plan Note (Signed)
He was given injection of Toradol today.  Started course of prednisone 50 mg x 5 days.  I did renew his hydrocodone for short-term use.  We discussed that if not improving with this I would recommend addition of formal physical therapy.  We can consider MRI as well if needed for interventional planning.

## 2021-12-03 ENCOUNTER — Telehealth: Payer: Self-pay

## 2021-12-03 NOTE — Telephone Encounter (Signed)
Pt lvm requesting information about a possible medication refill without appt. Pt has not been seen since 7/22. Appt required. Please contact the pt to schedule.  ?Thanks ? ?

## 2021-12-04 MED ORDER — VARENICLINE TARTRATE 1 MG PO TABS: 1.0000 mg | ORAL_TABLET | Freq: Two times a day (BID) | ORAL | 2 refills | Status: DC

## 2021-12-04 NOTE — Telephone Encounter (Signed)
Patient has been scheduled for this Friday, 12/07/21. ? ?He stated he needs a refill on his Chantix, he only has today and tomorrow left. ?He stated he just started taking this medication a little over 2 weeks ago. AMUCK ?

## 2021-12-04 NOTE — Telephone Encounter (Signed)
Pt is requesting Chantix refill. Please advise.  ?

## 2021-12-04 NOTE — Telephone Encounter (Signed)
Rx sent 

## 2021-12-07 ENCOUNTER — Ambulatory Visit: Payer: BC Managed Care – PPO | Admitting: Family Medicine

## 2021-12-07 ENCOUNTER — Encounter: Payer: Self-pay | Admitting: Family Medicine

## 2021-12-07 ENCOUNTER — Ambulatory Visit (INDEPENDENT_AMBULATORY_CARE_PROVIDER_SITE_OTHER): Payer: BC Managed Care – PPO

## 2021-12-07 VITALS — BP 117/74 | HR 61 | Ht 71.0 in | Wt 169.0 lb

## 2021-12-07 DIAGNOSIS — M549 Dorsalgia, unspecified: Secondary | ICD-10-CM | POA: Insufficient documentation

## 2021-12-07 DIAGNOSIS — M79671 Pain in right foot: Secondary | ICD-10-CM

## 2021-12-07 DIAGNOSIS — M5416 Radiculopathy, lumbar region: Secondary | ICD-10-CM | POA: Diagnosis not present

## 2021-12-07 DIAGNOSIS — M545 Low back pain, unspecified: Secondary | ICD-10-CM | POA: Diagnosis not present

## 2021-12-07 MED ORDER — HYDROCODONE-ACETAMINOPHEN 10-325 MG PO TABS: 1.0000 | ORAL_TABLET | Freq: Three times a day (TID) | ORAL | 0 refills | Status: DC | PRN

## 2021-12-07 MED ORDER — MELOXICAM 15 MG PO TABS: 15.0000 mg | ORAL_TABLET | Freq: Every day | ORAL | 3 refills | Status: DC | PRN

## 2021-12-07 NOTE — Assessment & Plan Note (Signed)
Non radicular at this time.  Meloxicam has worked well for him  Rx renewed today.  Will also renew norco to use for severe episodes of pain.

## 2021-12-07 NOTE — Assessment & Plan Note (Signed)
Seems to be peroneal tendinopathy.  xrays of foot ordered.  HEP printed. Advised to use regular shoe for a few weeks.

## 2021-12-07 NOTE — Progress Notes (Signed)
Manuel Weaver - 41 y.o. male MRN [id number]  Date of birth: Jun 04, 1981  Subjective Chief Complaint  Patient presents with   Foot Pain   Back Pain    HPI Manuel Weaver is a 41 y.o. male here today with complaint of back pain.  Has had similar episodes in the past that have responded well to meloxicam.  He notices this most when playing golf.  Occasionally needs norco for severe pain.  Denies radicular symptoms.    He is also having pain on the lateral R foot.  Pain located at the base of the 5th metatarsal.  He does wear flip flops most of the time.  ROS:  A comprehensive ROS was completed and negative except as noted per HPI  No Known Allergies  Past Medical History:  Diagnosis Date   History of COVID-19 07/22/2020    History reviewed. No pertinent surgical history.  Social History   Socioeconomic History   Marital status: Married    Spouse name: Not on file   Number of children: 1   Years of education: Not on file   Highest education level: Not on file  Occupational History   Not on file  Tobacco Use   Smoking status: Every Day    Packs/day: 0.75    Years: 15.00    Pack years: 11.25    Types: Cigarettes   Smokeless tobacco: Never  Vaping Use   Vaping Use: Never used  Substance and Sexual Activity   Alcohol use: Yes    Comment: socially   Drug use: Never   Sexual activity: Yes    Partners: Female  Other Topics Concern   Not on file  Social History Narrative   Not on file   Social Determinants of Health   Financial Resource Strain: Not on file  Food Insecurity: Not on file  Transportation Needs: Not on file  Physical Activity: Not on file  Stress: Not on file  Social Connections: Not on file    Family History  Problem Relation Age of Onset   Skin cancer Mother    Hypertension Father    Stroke Maternal Grandfather     Health Maintenance  Topic Date Due   COVID-19 Vaccine (3 - Booster for Moderna series) 03/22/2022 (Originally  12/20/2019)   Hepatitis C Screening  12/08/2022 (Originally 03/13/1997)   HIV Screening  12/08/2022 (Originally 03/13/1994)   INFLUENZA VACCINE  02/19/2022   TETANUS/TDAP  12/21/2027   HPV VACCINES  Aged Out     ----------------------------------------------------------------------------------------------------------------------------------------------------------------------------------------------------------------- Physical Exam BP 117/74 (BP Location: Left Arm, Patient Position: Sitting, Cuff Size: Normal)   Pulse 61   Ht 5\' 11"  (1.803 m)   Wt 169 lb (76.7 kg)   SpO2 98%   BMI 23.57 kg/m   Physical Exam Constitutional:      Appearance: Normal appearance.  Eyes:     General: No scleral icterus. Musculoskeletal:     Cervical back: Neck supple.     Comments: TTP at the base of the fifth metatarsal.  Some pain with inversion/resisted eversion.    Neurological:     Mental Status: He is alert.  Psychiatric:        Mood and Affect: Mood normal.        Behavior: Behavior normal.    ------------------------------------------------------------------------------------------------------------------------------------------------------------------------------------------------------------------- Assessment and Plan  Back pain Non radicular at this time.  Meloxicam has worked well for him  Rx renewed today.  Will also renew norco to use for severe episodes of pain.  Right foot pain Seems to be peroneal tendinopathy.  xrays of foot ordered.  HEP printed. Advised to use regular shoe for a few weeks.    Meds ordered this encounter  Medications   HYDROcodone-acetaminophen (NORCO) 10-325 MG tablet    Sig: Take 1 tablet by mouth every 8 (eight) hours as needed.    Dispense:  15 tablet    Refill:  0   meloxicam (MOBIC) 15 MG tablet    Sig: Take 1 tablet (15 mg total) by mouth daily as needed for pain. One tab PO qAM with a meal for 2 weeks, then daily prn pain.    Dispense:  30  tablet    Refill:  3    No follow-ups on file.    This visit occurred during the SARS-CoV-2 public health emergency.  Safety protocols were in place, including screening questions prior to the visit, additional usage of staff PPE, and extensive cleaning of exam room while observing appropriate contact time as indicated for disinfecting solutions.

## 2022-03-01 ENCOUNTER — Telehealth: Payer: Self-pay

## 2022-03-01 NOTE — Telephone Encounter (Signed)
Pt lvm stating he had not received a return call concerning appt scheduling.  Front Desk: Please contact the patient to schedule. I'm unaware of the reason for visit.   Thanks

## 2022-03-05 ENCOUNTER — Ambulatory Visit: Payer: BC Managed Care – PPO | Admitting: Family Medicine

## 2022-03-05 ENCOUNTER — Encounter: Payer: Self-pay | Admitting: Family Medicine

## 2022-03-05 ENCOUNTER — Ambulatory Visit (INDEPENDENT_AMBULATORY_CARE_PROVIDER_SITE_OTHER): Payer: BC Managed Care – PPO

## 2022-03-05 VITALS — BP 132/86 | HR 63 | Ht 71.0 in | Wt 168.0 lb

## 2022-03-05 DIAGNOSIS — M5442 Lumbago with sciatica, left side: Secondary | ICD-10-CM

## 2022-03-05 DIAGNOSIS — M5416 Radiculopathy, lumbar region: Secondary | ICD-10-CM

## 2022-03-05 MED ORDER — PREDNISONE 50 MG PO TABS: ORAL_TABLET | ORAL | 0 refills | Status: DC

## 2022-03-05 MED ORDER — CHANTIX STARTING MONTH PAK 0.5 MG X 11 & 1 MG X 42 PO TBPK: ORAL_TABLET | ORAL | 0 refills | Status: DC

## 2022-03-05 MED ORDER — KETOROLAC TROMETHAMINE 30 MG/ML IJ SOLN
30.0000 mg | Freq: Once | INTRAMUSCULAR | Status: AC
  Administered 2022-03-05: 30 mg via INTRAMUSCULAR

## 2022-03-05 NOTE — Progress Notes (Signed)
SPIRO AUSBORN - 41 y.o. male MRN [id number]  Date of birth: 1981-04-29  Subjective Chief Complaint  Patient presents with   Back Pain    HPI Manuel Weaver is a 41 y.o. male here today with complaint of low back pain.  Pain located on the L side.  Having some radiation into the left leg.  Has been using meloxicam and norco which provides some improvement.  He has this flare up about once per year.  Toradol injection and prednisone burst worked well for him previously.  He denies leg weakness or urinary symptoms, .   ROS:  A comprehensive ROS was completed and negative except as noted per HPI  No Known Allergies  Past Medical History:  Diagnosis Date   History of COVID-19 07/22/2020    History reviewed. No pertinent surgical history.  Social History   Socioeconomic History   Marital status: Married    Spouse name: Not on file   Number of children: 1   Years of education: Not on file   Highest education level: Not on file  Occupational History   Not on file  Tobacco Use   Smoking status: Every Day    Packs/day: 0.75    Years: 15.00    Total pack years: 11.25    Types: Cigarettes   Smokeless tobacco: Never  Vaping Use   Vaping Use: Never used  Substance and Sexual Activity   Alcohol use: Yes    Comment: socially   Drug use: Never   Sexual activity: Yes    Partners: Female  Other Topics Concern   Not on file  Social History Narrative   Not on file   Social Determinants of Health   Financial Resource Strain: Not on file  Food Insecurity: Not on file  Transportation Needs: Not on file  Physical Activity: Not on file  Stress: Not on file  Social Connections: Not on file    Family History  Problem Relation Age of Onset   Skin cancer Mother    Hypertension Father    Stroke Maternal Grandfather     Health Maintenance  Topic Date Due   COVID-19 Vaccine (3 - Moderna series) 03/22/2022 (Originally 12/20/2019)   INFLUENZA VACCINE  10/20/2022  (Originally 02/19/2022)   Hepatitis C Screening  12/08/2022 (Originally 03/13/1997)   HIV Screening  12/08/2022 (Originally 03/13/1994)   TETANUS/TDAP  12/21/2027   HPV VACCINES  Aged Out     ----------------------------------------------------------------------------------------------------------------------------------------------------------------------------------------------------------------- Physical Exam BP 132/86 (BP Location: Left Arm, Patient Position: Sitting, Cuff Size: Normal)   Pulse 63   Ht 5\' 11"  (1.803 m)   Wt 168 lb (76.2 kg)   SpO2 98%   BMI 23.43 kg/m   Physical Exam Constitutional:      Appearance: Normal appearance.  Musculoskeletal:        General: Normal range of motion.     Comments: Normal gait.   Neurological:     Mental Status: He is alert.  Psychiatric:        Mood and Affect: Mood normal.        Behavior: Behavior normal.     ------------------------------------------------------------------------------------------------------------------------------------------------------------------------------------------------------------------- Assessment and Plan  Left lumbar radiculitis Injection of toradol 30mg  given today.  Start prednisone burst 50mg  daily x5 days. Incorporated home exercise plan, handout given.  Updated xrays ordered. Call if not improving or developing worsening symptoms.    Meds ordered this encounter  Medications   predniSONE (DELTASONE) 50 MG tablet    Sig: Take 1 tab po  daily x5 days.    Dispense:  5 tablet    Refill:  0    No follow-ups on file.    This visit occurred during the SARS-CoV-2 public health emergency.  Safety protocols were in place, including screening questions prior to the visit, additional usage of staff PPE, and extensive cleaning of exam room while observing appropriate contact time as indicated for disinfecting solutions.

## 2022-03-05 NOTE — Addendum Note (Signed)
Addended by: Mammie Lorenzo on: 03/05/2022 01:48 PM   Modules accepted: Orders

## 2022-03-05 NOTE — Assessment & Plan Note (Signed)
Injection of toradol 30mg  given today.  Start prednisone burst 50mg  daily x5 days. Incorporated home exercise plan, handout given.  Updated xrays ordered. Call if not improving or developing worsening symptoms.

## 2022-03-05 NOTE — Patient Instructions (Signed)
Start prednisone 50mg  daily x5 days.  Work on home exercises.  Have xray completed.   Let me know if not improving.

## 2022-03-05 NOTE — Addendum Note (Signed)
Addended by: Ardyth Man on: 03/05/2022 02:29 PM   Modules accepted: Orders

## 2022-03-06 NOTE — Telephone Encounter (Signed)
Task completed. Patient was seen by provider for low back pain on 03/05/22.

## 2022-03-11 ENCOUNTER — Ambulatory Visit: Payer: BC Managed Care – PPO | Admitting: Sports Medicine

## 2022-04-30 ENCOUNTER — Encounter: Payer: Self-pay | Admitting: Family Medicine

## 2022-04-30 ENCOUNTER — Ambulatory Visit (INDEPENDENT_AMBULATORY_CARE_PROVIDER_SITE_OTHER): Payer: BC Managed Care – PPO | Admitting: Family Medicine

## 2022-04-30 VITALS — BP 119/71 | HR 67 | Ht 71.0 in | Wt 169.0 lb

## 2022-04-30 DIAGNOSIS — M5416 Radiculopathy, lumbar region: Secondary | ICD-10-CM | POA: Diagnosis not present

## 2022-04-30 DIAGNOSIS — F411 Generalized anxiety disorder: Secondary | ICD-10-CM

## 2022-04-30 DIAGNOSIS — F17210 Nicotine dependence, cigarettes, uncomplicated: Secondary | ICD-10-CM

## 2022-04-30 DIAGNOSIS — Z13220 Encounter for screening for lipoid disorders: Secondary | ICD-10-CM | POA: Diagnosis not present

## 2022-04-30 DIAGNOSIS — M79671 Pain in right foot: Secondary | ICD-10-CM

## 2022-04-30 DIAGNOSIS — Z0000 Encounter for general adult medical examination without abnormal findings: Secondary | ICD-10-CM

## 2022-04-30 DIAGNOSIS — Z23 Encounter for immunization: Secondary | ICD-10-CM | POA: Diagnosis not present

## 2022-04-30 MED ORDER — HYDROCODONE-ACETAMINOPHEN 10-325 MG PO TABS: 1.0000 | ORAL_TABLET | Freq: Three times a day (TID) | ORAL | 0 refills | Status: DC | PRN

## 2022-04-30 NOTE — Assessment & Plan Note (Addendum)
Well adult Orders Placed This Encounter  Procedures  . Flu Vaccine QUAD 6+ mos PF IM (Fluarix Quad PF)  . COMPLETE METABOLIC PANEL WITH GFR  . CBC with Differential  . Lipid Panel w/reflex Direct LDL  . TSH  Screenings: Per lab orders Immunizations:  Flu vaccine given today.  Anticipatory guidance/Risk factor reduction:  Recommendations per AVS.

## 2022-04-30 NOTE — Assessment & Plan Note (Signed)
Referral to podiatry  

## 2022-04-30 NOTE — Assessment & Plan Note (Signed)
May want to try medication in the future, he will let me know.

## 2022-04-30 NOTE — Progress Notes (Signed)
podiat Manuel Weaver - 41 y.o. male MRN [id number]  Date of birth: 1980/10/19  Subjective Chief Complaint  Patient presents with   Annual Exam    HPI Manuel Weaver is a 41 y.o. male here today for annual exam.   Continues to have some issues with his lower back.  Pain is intermittent.  Having pain in R foot.   He tries to stay fairly active.  Feels like diet is pretty good.   He continues to smoke daily.  Has chantix but has not started yet.  Has some increased anxiety.  May want to start medication but not yet. Consumes EtOH occasionally.   Immunizations:  Flu vaccine given.    Review of Systems  Constitutional:  Negative for chills, fever, malaise/fatigue and weight loss.  HENT:  Negative for congestion, ear pain and sore throat.   Eyes:  Negative for blurred vision, double vision and pain.  Respiratory:  Negative for cough and shortness of breath.   Cardiovascular:  Negative for chest pain and palpitations.  Gastrointestinal:  Negative for abdominal pain, blood in stool, constipation, heartburn and nausea.  Genitourinary:  Negative for dysuria and urgency.  Musculoskeletal:  Negative for joint pain and myalgias.  Neurological:  Negative for dizziness and headaches.  Endo/Heme/Allergies:  Does not bruise/bleed easily.  Psychiatric/Behavioral:  Negative for depression. The patient is not nervous/anxious and does not have insomnia.     No Known Allergies  Past Medical History:  Diagnosis Date   History of COVID-19 07/22/2020    No past surgical history on file.  Social History   Socioeconomic History   Marital status: Married    Spouse name: Not on file   Number of children: 1   Years of education: Not on file   Highest education level: Not on file  Occupational History   Not on file  Tobacco Use   Smoking status: Every Day    Packs/day: 0.75    Years: 15.00    Total pack years: 11.25    Types: Cigarettes   Smokeless tobacco: Never  Vaping  Use   Vaping Use: Never used  Substance and Sexual Activity   Alcohol use: Yes    Comment: socially   Drug use: Never   Sexual activity: Yes    Partners: Female  Other Topics Concern   Not on file  Social History Narrative   Not on file   Social Determinants of Health   Financial Resource Strain: Not on file  Food Insecurity: Not on file  Transportation Needs: Not on file  Physical Activity: Not on file  Stress: Not on file  Social Connections: Not on file    Family History  Problem Relation Age of Onset   Skin cancer Mother    Hypertension Father    Stroke Maternal Grandfather     Health Maintenance  Topic Date Due   COVID-19 Vaccine (3 - Moderna series) 08/22/2022 (Originally 12/20/2019)   Hepatitis C Screening  12/08/2022 (Originally 03/13/1997)   HIV Screening  12/08/2022 (Originally 03/13/1994)   TETANUS/TDAP  12/21/2027   INFLUENZA VACCINE  Completed   HPV VACCINES  Aged Out     ----------------------------------------------------------------------------------------------------------------------------------------------------------------------------------------------------------------- Physical Exam BP 119/71 (BP Location: Left Arm, Patient Position: Sitting, Cuff Size: Normal)   Pulse 67   Ht 5\' 11"  (1.803 m)   Wt 169 lb (76.7 kg)   SpO2 99%   BMI 23.57 kg/m   Physical Exam Constitutional:      General: He is not  in acute distress. HENT:     Head: Normocephalic and atraumatic.     Right Ear: Tympanic membrane and external ear normal.     Left Ear: Tympanic membrane and external ear normal.  Eyes:     General: No scleral icterus. Neck:     Thyroid: No thyromegaly.  Cardiovascular:     Rate and Rhythm: Normal rate and regular rhythm.     Heart sounds: Normal heart sounds.  Pulmonary:     Effort: Pulmonary effort is normal.     Breath sounds: Normal breath sounds.  Abdominal:     General: Bowel sounds are normal. There is no distension.      Palpations: Abdomen is soft.     Tenderness: There is no abdominal tenderness. There is no guarding.  Musculoskeletal:     Cervical back: Normal range of motion.  Lymphadenopathy:     Cervical: No cervical adenopathy.  Skin:    General: Skin is warm and dry.     Findings: No rash.  Neurological:     Mental Status: He is alert and oriented to person, place, and time.     Cranial Nerves: No cranial nerve deficit.     Motor: No abnormal muscle tone.  Psychiatric:        Mood and Affect: Mood normal.        Behavior: Behavior normal.     ------------------------------------------------------------------------------------------------------------------------------------------------------------------------------------------------------------------- Assessment and Plan  Well adult exam Well adult Orders Placed This Encounter  Procedures   Flu Vaccine QUAD 6+ mos PF IM (Fluarix Quad PF)   COMPLETE METABOLIC PANEL WITH GFR   CBC with Differential   Lipid Panel w/reflex Direct LDL   TSH  Screenings: Per lab orders Immunizations:  Flu vaccine given today.  Anticipatory guidance/Risk factor reduction:  Recommendations per AVS.   Nicotine dependence Has chantix, planning on starting.   GAD (generalized anxiety disorder) May want to try medication in the future, he will let me know.   Right foot pain Referral to podiatry.    Meds ordered this encounter  Medications   HYDROcodone-acetaminophen (NORCO) 10-325 MG tablet    Sig: Take 1 tablet by mouth every 8 (eight) hours as needed.    Dispense:  15 tablet    Refill:  0    No follow-ups on file.    This visit occurred during the SARS-CoV-2 public health emergency.  Safety protocols were in place, including screening questions prior to the visit, additional usage of staff PPE, and extensive cleaning of exam room while observing appropriate contact time as indicated for disinfecting solutions.

## 2022-04-30 NOTE — Assessment & Plan Note (Signed)
Has chantix, planning on starting.

## 2022-04-30 NOTE — Patient Instructions (Signed)
Preventive Care 43-43 43 Years Old, Male ?Preventive care refers to lifestyle choices and visits with your health care provider that can promote health and wellness. Preventive care visits are also called wellness exams. ?What can I expect for my preventive care visit? ?Counseling ?During your preventive care visit, your health care provider may ask about your: ?Medical history, including: ?Past medical problems. ?Family medical history. ?Current health, including: ?Emotional well-being. ?Home life and relationship well-being. ?Sexual activity. ?Lifestyle, including: ?Alcohol, nicotine or tobacco, and drug use. ?Access to firearms. ?Diet, exercise, and sleep habits. ?Safety issues such as seatbelt and bike helmet use. ?Sunscreen use. ?Work and work environment. ?Physical exam ?Your health care provider will check your: ?Height and weight. These may be used to calculate your BMI (body mass index). BMI is a measurement that tells if you are at a healthy weight. ?Waist circumference. This measures the distance around your waistline. This measurement also tells if you are at a healthy weight and may help predict your risk of certain diseases, such as type 2 diabetes and high blood pressure. ?Heart rate and blood pressure. ?Body temperature. ?Skin for abnormal spots. ?What immunizations do I need? ? ?Vaccines are usually given at various ages, according to a schedule. Your health care provider will recommend vaccines for you based on your age, medical history, and lifestyle or other factors, such as travel or where you work. ?What tests do I need? ?Screening ?Your health care provider may recommend screening tests for certain conditions. This may include: ?Lipid and cholesterol levels. ?Diabetes screening. This is done by checking your blood sugar (glucose) after you have not eaten for a while (fasting). ?Hepatitis B test. ?Hepatitis C test. ?HIV (human immunodeficiency virus) test. ?STI (sexually transmitted infection)  testing, if you are at risk. ?Lung cancer screening. ?Prostate cancer screening. ?Colorectal cancer screening. ?Talk with your health care provider about your test results, treatment options, and if necessary, the need for more tests. ?Follow these instructions at home: ?Eating and drinking ? ?Eat a diet that includes fresh fruits and vegetables, whole grains, lean protein, and low-fat dairy products. ?Take vitamin and mineral supplements as recommended by your health care provider. ?Do not drink alcohol if your health care provider tells you not to drink. ?If you drink alcohol: ?Limit how much you have to 0-2 drinks a day. ?Know how much alcohol is in your drink. In the [place] one drink equals one 12 oz bottle of beer (355 mL), one 5 oz glass of wine (148 mL), or one 1? oz glass of hard liquor (44 mL). ?Lifestyle ?Brush your teeth every morning and night with fluoride toothpaste. Floss one time each day. ?Exercise for at least 30 minutes 5 or more days each week. ?Do not use any products that contain nicotine or tobacco. These products include cigarettes, chewing tobacco, and vaping devices, such as e-cigarettes. If you need help quitting, ask your health care provider. ?Do not use drugs. ?If you are sexually active, practice safe sex. Use a condom or other form of protection to prevent STIs. ?Take aspirin only as told by your health care provider. Make sure that you understand how much to take and what form to take. Work with your health care provider to find out whether it is safe and beneficial for you to take aspirin daily. ?Find healthy ways to manage stress, such as: ?Meditation, yoga, or listening to music. ?Journaling. ?Talking to a trusted person. ?Spending time with friends and family. ?Minimize exposure to UV radiation to reduce   your risk of skin cancer. ?Safety ?Always wear your seat belt while driving or riding in a vehicle. ?Do not drive: ?If you have been drinking alcohol. Do not ride with someone who  has been drinking. ?When you are tired or distracted. ?While texting. ?If you have been using any mind-altering substances or drugs. ?Wear a helmet and other protective equipment during sports activities. ?If you have firearms in your house, make sure you follow all gun safety procedures. ?What's next? ?Go to your health care provider once a year for an annual wellness visit. ?Ask your health care provider how often you should have your eyes and teeth checked. ?Stay up to date on all vaccines. ?This information is not intended to replace advice given to you by your health care provider. Make sure you discuss any questions you have with your health care provider. ?Document Revised: 01/03/2021 Document Reviewed: 01/03/2021 ?Elsevier Patient Education ? 2023 [employer] ? ?

## 2022-05-01 LAB — COMPLETE METABOLIC PANEL WITH GFR
AG Ratio: 2.3 (calc) (ref 1.0–2.5)
ALT: 11 U/L (ref 9–46)
AST: 12 U/L (ref 10–40)
Albumin: 4.8 g/dL (ref 3.6–5.1)
Alkaline phosphatase (APISO): 86 U/L (ref 36–130)
BUN: 12 mg/dL (ref 7–25)
CO2: 30 mmol/L (ref 20–32)
Calcium: 10 mg/dL (ref 8.6–10.3)
Chloride: 104 mmol/L (ref 98–110)
Creat: 1.01 mg/dL (ref 0.60–1.29)
Globulin: 2.1 g/dL (calc) (ref 1.9–3.7)
Glucose, Bld: 105 mg/dL — ABNORMAL HIGH (ref 65–99)
Potassium: 5.2 mmol/L (ref 3.5–5.3)
Sodium: 141 mmol/L (ref 135–146)
Total Bilirubin: 0.7 mg/dL (ref 0.2–1.2)
Total Protein: 6.9 g/dL (ref 6.1–8.1)
eGFR: 95 mL/min/{1.73_m2} (ref >=60)

## 2022-05-01 LAB — CBC WITH DIFFERENTIAL/PLATELET
Absolute Monocytes: 470 cells/uL (ref 200–950)
Basophils Absolute: 48 cells/uL (ref 0–200)
Basophils Relative: 0.5 %
Eosinophils Absolute: 230 cells/uL (ref 15–500)
Eosinophils Relative: 2.4 %
HCT: 49.2 % (ref 38.5–50.0)
Hemoglobin: 17.5 g/dL — ABNORMAL HIGH (ref 13.2–17.1)
Lymphs Abs: 1757 cells/uL (ref 850–3900)
MCH: 33.1 pg — ABNORMAL HIGH (ref 27.0–33.0)
MCHC: 35.6 g/dL (ref 32.0–36.0)
MCV: 93.2 fL (ref 80.0–100.0)
MPV: 11.4 fL (ref 7.5–12.5)
Monocytes Relative: 4.9 %
Neutro Abs: 7094 cells/uL (ref 1500–7800)
Neutrophils Relative %: 73.9 %
Platelets: 234 10*3/uL (ref 140–400)
RBC: 5.28 10*6/uL (ref 4.20–5.80)
RDW: 11.8 % (ref 11.0–15.0)
Total Lymphocyte: 18.3 %
WBC: 9.6 10*3/uL (ref 3.8–10.8)

## 2022-05-01 LAB — LIPID PANEL W/REFLEX DIRECT LDL
Cholesterol: 164 mg/dL (ref <200)
HDL: 55 mg/dL (ref >=40)
LDL Cholesterol (Calc): 89 mg/dL (calc)
Non-HDL Cholesterol (Calc): 109 mg/dL (calc) (ref <130)
Total CHOL/HDL Ratio: 3 (calc) (ref <5.0)
Triglycerides: 102 mg/dL (ref <150)

## 2022-05-01 LAB — TSH: TSH: 1.46 mIU/L (ref 0.40–4.50)

## 2022-05-16 ENCOUNTER — Ambulatory Visit: Payer: BC Managed Care – PPO | Admitting: Podiatrist

## 2022-05-16 ENCOUNTER — Encounter: Payer: Self-pay | Admitting: Podiatrist

## 2022-05-16 DIAGNOSIS — L851 Acquired keratosis [keratoderma] palmaris et plantaris: Secondary | ICD-10-CM

## 2022-05-16 DIAGNOSIS — M79671 Pain in right foot: Secondary | ICD-10-CM

## 2022-05-16 DIAGNOSIS — M21621 Bunionette of right foot: Secondary | ICD-10-CM

## 2022-05-16 NOTE — Progress Notes (Signed)
  Chief Complaint  Patient presents with   Foot Pain    Right foot pain- on going for months     HPI: Patient is 41 y.o. male who presents today for pain on the plantar lateral right foot that has been going on for several months. He relates he wears flip flops a lot and pain has gradually gotten worse.  His primary care doctor worked on the area and removed some dead skin but he relates the pain came back soon after. He denies stepping on anything or any acute pain in the foot that he can recall.   Patient Active Problem List   Diagnosis Date Noted   Right foot pain 12/07/2021   Back pain 12/07/2021   Well adult exam 11/15/2020   Allergic rhinitis 10/29/2020   GAD (generalized anxiety disorder) 10/29/2020   Nicotine dependence 10/29/2020   Left lumbar radiculitis 10/11/2019    Current Outpatient Medications on File Prior to Visit  Medication Sig Dispense Refill   HYDROcodone-acetaminophen (NORCO) 10-325 MG tablet Take 1 tablet by mouth every 8 (eight) hours as needed. 15 tablet 0   meloxicam (MOBIC) 15 MG tablet Take 1 tablet (15 mg total) by mouth daily as needed for pain. One tab PO qAM with a meal for 2 weeks, then daily prn pain. 30 tablet 3   Varenicline Tartrate, Starter, (CHANTIX STARTING MONTH PAK) 0.5 MG X 11 & 1 MG X 42 TBPK Take medication as directed. 53 each 0   No current facility-administered medications on file prior to visit.    No Known Allergies  Review of Systems No fevers, chills, nausea, muscle aches, no difficulty breathing, no calf pain, no chest pain or shortness of breath.   Physical Exam  GENERAL APPEARANCE: Alert, conversant. Appropriately groomed. No acute distress.   VASCULAR: Pedal pulses palpable 2/4 DP and  PT bilateral.  Capillary refill time is immediate to all digits,  Proximal to distal cooling is warm to warm.  Digital perfusion adequate.   NEUROLOGIC: sensation is intact to 5.07 monofilament at 5/5 sites bilateral.  Light touch is intact  bilateral, vibratory sensation intact bilateral  MUSCULOSKELETAL: acceptable muscle strength, tone and stability bilateral.  Mild tailors bunion noted on the right foot.   No pain, crepitus or limitation noted with foot and ankle range of motion bilateral.   DERMATOLOGIC: skin is warm, supple, and dry.  Well circumscribed area of hyperkeratotic tissue present submet 5 of  the right foot. Pain with direct pressure is noted.  Upon debridement, no obvious foreign body is seen.    Xrays taken and show a mild tailors bunion noted. No acute osseous changes seen. No joint abnormalities noted.     Assessment     ICD-10-CM   1. Right foot pain  M79.671 CANCELED: DG Foot Complete Right    2. Acquired plantar porokeratosis  L85.1     3. Tailor's bunion of right foot  M21.621        Plan  Discussed xray and exam findings. His foot structure does lend itself to a plantar callus/ lesion -  I trimmed the area today and excised the deeply enucleated core. No obvous foreign body was seen, however, should the area come back, I recommended numbing the area and deelply excising the lesion.  This would put him off his foot for a week to 10 days.  He will call if further treatment is needed.

## 2022-05-22 IMAGING — DX DG FOOT COMPLETE 3+V*R*
3 series · 3 of 3 positions shown · non-contrast
Comparison: None Available.

CLINICAL DATA: Right posterior lateral foot pain base of 5th
metatarsal.

EXAM:
RIGHT FOOT COMPLETE - 3+ VIEW

[foot ap]
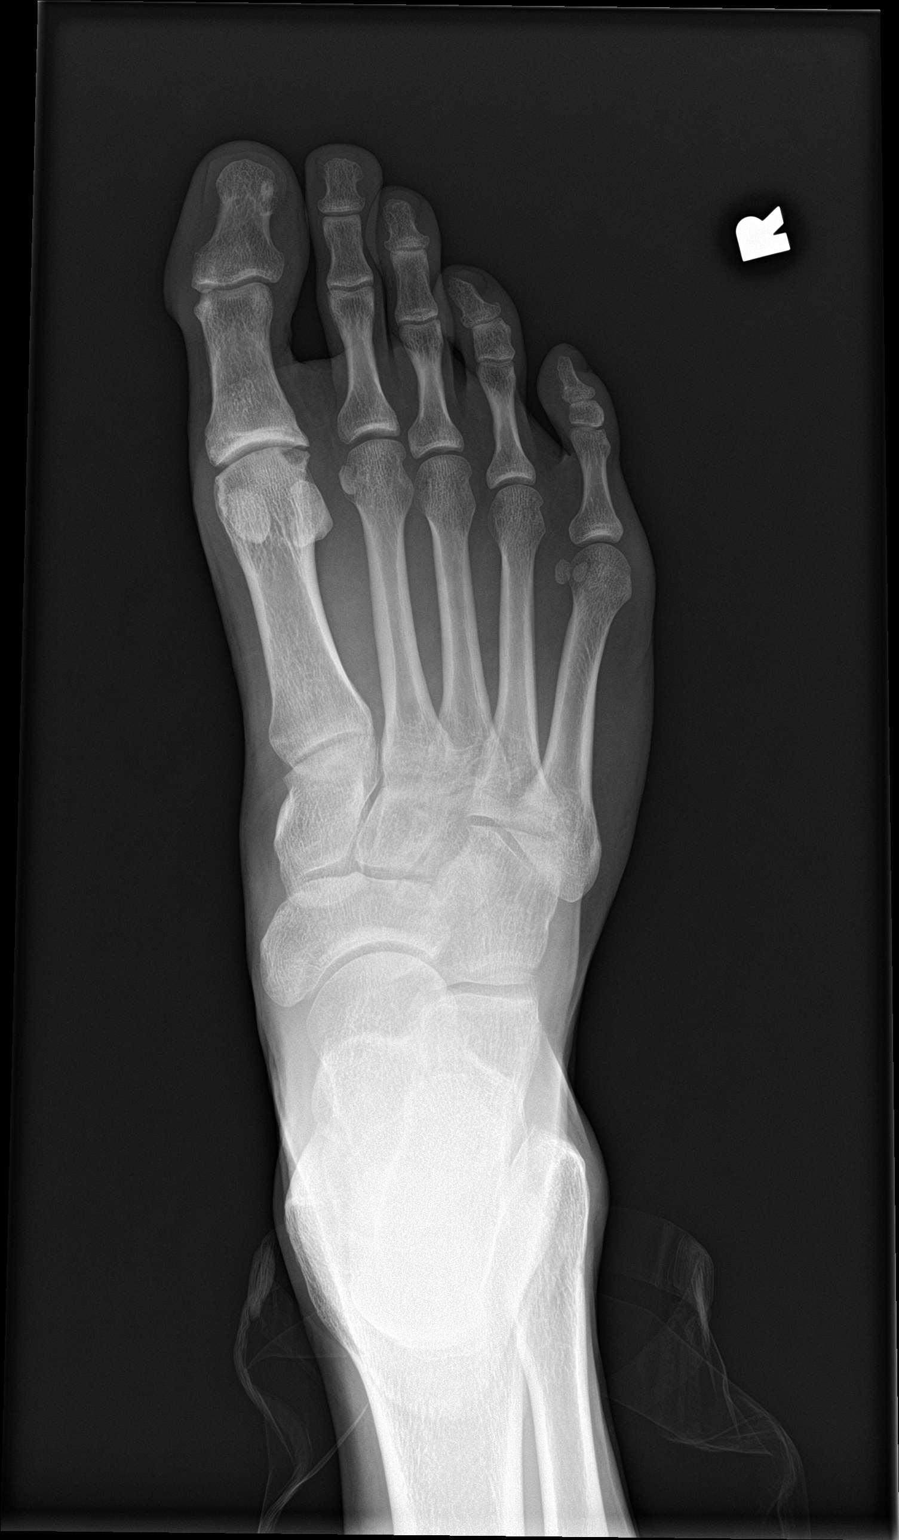

[foot obl]
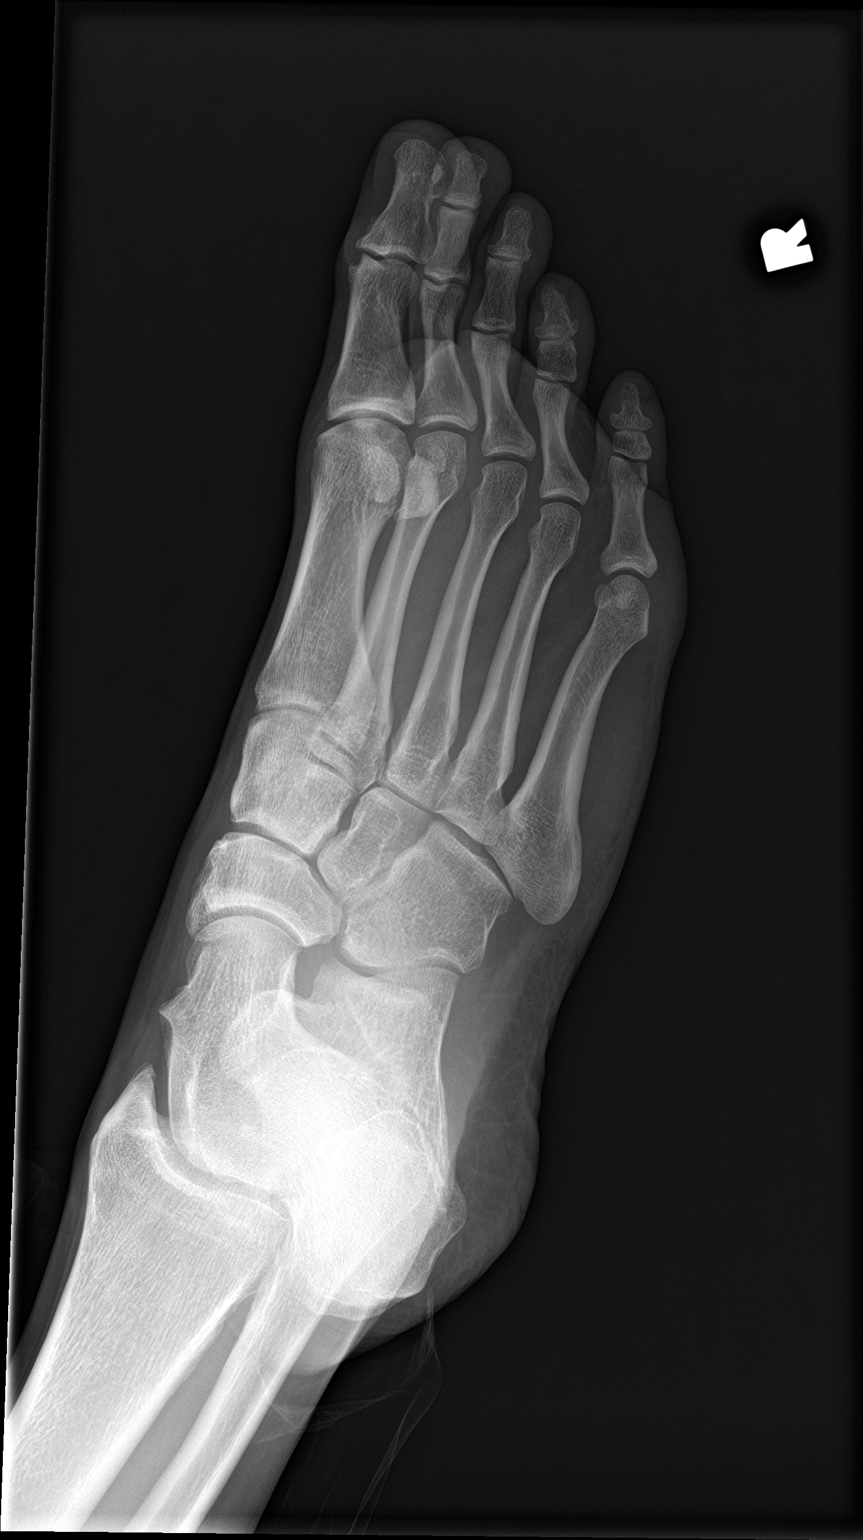

[foot lat]
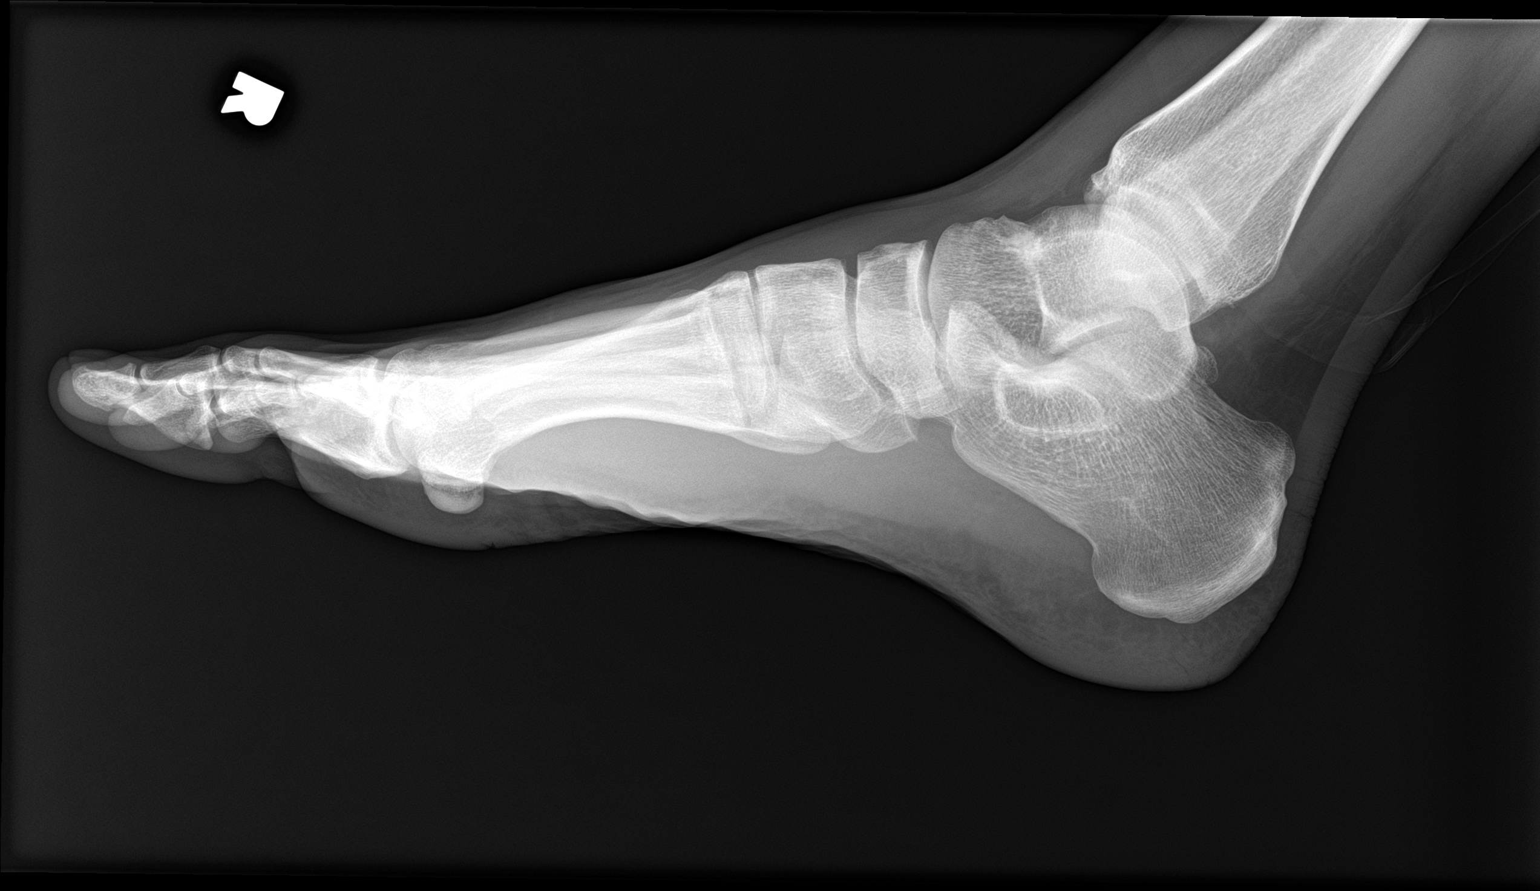

[3 of 3 positions shown; findings below may reference images not displayed]

FINDINGS: First MTP joint degenerative changes. Normal anatomic alignment. No
evidence for acute fracture or dislocation. Regional soft tissues
unremarkable.
IMPRESSION: No acute osseous abnormality.

## 2022-07-31 ENCOUNTER — Telehealth: Payer: Self-pay

## 2022-07-31 NOTE — Telephone Encounter (Signed)
Is he wanting starter pack again or continuing pack at this time?

## 2022-08-06 MED ORDER — VARENICLINE TARTRATE 0.5 MG PO TABS: 0.5000 mg | ORAL_TABLET | Freq: Two times a day (BID) | ORAL | 3 refills | Status: DC

## 2022-08-06 NOTE — Telephone Encounter (Signed)
Continuing pack sent in.   Thanks!

## 2022-12-04 ENCOUNTER — Encounter: Payer: Self-pay | Admitting: Family Medicine

## 2022-12-04 ENCOUNTER — Telehealth (INDEPENDENT_AMBULATORY_CARE_PROVIDER_SITE_OTHER): Payer: BC Managed Care – PPO | Admitting: Family Medicine

## 2022-12-04 VITALS — Temp 97.8°F | Ht 71.0 in | Wt 168.0 lb

## 2022-12-04 DIAGNOSIS — M545 Low back pain, unspecified: Secondary | ICD-10-CM | POA: Diagnosis not present

## 2022-12-04 DIAGNOSIS — F17210 Nicotine dependence, cigarettes, uncomplicated: Secondary | ICD-10-CM | POA: Diagnosis not present

## 2022-12-04 DIAGNOSIS — F411 Generalized anxiety disorder: Secondary | ICD-10-CM | POA: Diagnosis not present

## 2022-12-04 DIAGNOSIS — M5416 Radiculopathy, lumbar region: Secondary | ICD-10-CM

## 2022-12-04 MED ORDER — NICOTINE 21 MG/24HR TD PT24: 21.0000 mg | MEDICATED_PATCH | Freq: Every day | TRANSDERMAL | 0 refills | Status: DC

## 2022-12-04 MED ORDER — ESCITALOPRAM OXALATE 10 MG PO TABS: ORAL_TABLET | ORAL | 0 refills | Status: DC

## 2022-12-04 MED ORDER — NICOTINE 7 MG/24HR TD PT24: 7.0000 mg | MEDICATED_PATCH | Freq: Every day | TRANSDERMAL | 0 refills | Status: DC

## 2022-12-04 MED ORDER — HYDROCODONE-ACETAMINOPHEN 10-325 MG PO TABS: 1.0000 | ORAL_TABLET | Freq: Three times a day (TID) | ORAL | 0 refills | Status: DC | PRN

## 2022-12-04 MED ORDER — NICOTINE 14 MG/24HR TD PT24
14.0000 mg | MEDICATED_PATCH | Freq: Every day | TRANSDERMAL | 0 refills | Status: DC
Start: 2022-12-04 — End: 2023-11-12

## 2022-12-04 NOTE — Progress Notes (Signed)
Manuel Weaver - 42 y.o. male MRN [id number]  Date of birth: 07/14/1981    All issues noted in this document were discussed and addressed.  No physical exam was performed (except for noted visual exam findings with Video Visits).  I discussed the limitations of evaluation and management by telemedicine and the availability of in person appointments. The patient expressed understanding and agreed to proceed.  I connected withNAME@ on 12/04/22 at 11:30 AM EDT by a video enabled telemedicine application and verified that I am speaking with the correct person using two identifiers.  Present at visit: Everrett Coombe, DO [facility]   Patient Location: Home [address] [place] [address]   Provider location:   [facility]  Chief Complaint  Patient presents with   Nicotine Dependence   Anxiety    HPI  Manuel Weaver is a 42 y.o. male who presents via audio/video conferencing for a telehealth visit today.  He reports he has had increased anxiety with some irritability.  His like his anxiety is contributing to his difficulty with quitting smoking.  Would like to try something to help with his anxiety.  He is not tried anything in the past.  Reports that his wife is on sertraline she suggested that he consider something with this.  He continues to have difficulty with quitting smoking.  He relates all of this to his anxiety.  Has not really had success with Chantix and did have some side effects of headache.  He would like to try patches.  He has tried these and they seem to help but would like a prescription.  Reports that he tweaked his back recently well.  He has his 2-3 times a year.  Typically improves on its own but uses hydrocodone for a brief period to help with pain.  Denies any radiation of pain, numbness or tingling.     ROS:  A comprehensive ROS was completed and negative except as noted per HPI  Past Medical History:  Diagnosis Date    History of COVID-19 07/22/2020    History reviewed. No pertinent surgical history.  Family History  Problem Relation Age of Onset   Skin cancer Mother    Hypertension Father    Stroke Maternal Grandfather     Social History   Socioeconomic History   Marital status: Married    Spouse name: Not on file   Number of children: 1   Years of education: Not on file   Highest education level: Not on file  Occupational History   Not on file  Tobacco Use   Smoking status: Every Day    Packs/day: 0.75    Years: 15.00    Additional pack years: 0.00    Total pack years: 11.25    Types: Cigarettes   Smokeless tobacco: Never  Vaping Use   Vaping Use: Never used  Substance and Sexual Activity   Alcohol use: Yes    Comment: socially   Drug use: Never   Sexual activity: Yes    Partners: Female  Other Topics Concern   Not on file  Social History Narrative   Not on file   Social Determinants of Health   Financial Resource Strain: Not on file  Food Insecurity: Not on file  Transportation Needs: Not on file  Physical Activity: Not on file  Stress: Not on file  Social Connections: Not on file  Intimate Partner Violence: Not on file     Current Outpatient Medications:  escitalopram (LEXAPRO) 10 MG tablet, Take 5mg  daily x7 days then increase to 10mg  daily, Disp: 90 tablet, Rfl: 0   meloxicam (MOBIC) 15 MG tablet, Take 1 tablet (15 mg total) by mouth daily as needed for pain. One tab PO qAM with a meal for 2 weeks, then daily prn pain., Disp: 30 tablet, Rfl: 3   nicotine (NICODERM CQ) 14 mg/24hr patch, Place 1 patch (14 mg total) onto the skin daily. Use x14 days then decrease to 7mg  patch, Disp: 14 patch, Rfl: 0   nicotine (NICODERM CQ) 21 mg/24hr patch, Place 1 patch (21 mg total) onto the skin daily. Use x14 days then decrease to 14mg  patch, Disp: 14 patch, Rfl: 0   nicotine (NICODERM CQ) 7 mg/24hr patch, Place 1 patch (7 mg total) onto the skin daily., Disp: 28 patch, Rfl: 0    HYDROcodone-acetaminophen (NORCO) 10-325 MG tablet, Take 1 tablet by mouth every 8 (eight) hours as needed., Disp: 15 tablet, Rfl: 0  EXAM:  VITALS per patient if applicable: Temp 97.8 F (36.6 C)   Ht 5\' 11"  (1.803 m)   Wt 168 lb (76.2 kg)   BMI 23.43 kg/m   GENERAL: alert, oriented, appears well and in no acute distress  HEENT: atraumatic, conjunttiva clear, no obvious abnormalities on inspection of external nose and ears  NECK: normal movements of the head and neck  LUNGS: on inspection no signs of respiratory distress, breathing rate appears normal, no obvious gross SOB, gasping or wheezing  CV: no obvious cyanosis  MS: moves all visible extremities without noticeable abnormality  PSYCH/NEURO: pleasant and cooperative, no obvious depression or anxiety, speech and thought processing grossly intact  ASSESSMENT AND PLAN:  Discussed the following assessment and plan:  Nicotine dependence Adding NicoDerm taper.  Encouraged to set a quit date.  Back pain This occurs intermittently.  Recent flare is typical what he usually has.  Adding short term Norco back on.  GAD (generalized anxiety disorder) He would like to try medication at this time.  We discussed options and will add Lexapro.  Initially started 5 mg with increase to 10 mg after 1 week.  Follow-up in 6 weeks.     I discussed the assessment and treatment plan with the patient. The patient was provided an opportunity to ask questions and all were answered. The patient agreed with the plan and demonstrated an understanding of the instructions.   The patient was advised to call back or seek an in-person evaluation if the symptoms worsen or if the condition fails to improve as anticipated.    Everrett Coombe, DO

## 2022-12-04 NOTE — Progress Notes (Signed)
Smoking cessation patches work better than Chantix.

## 2022-12-04 NOTE — Assessment & Plan Note (Signed)
Adding NicoDerm taper.  Encouraged to set a quit date.

## 2022-12-04 NOTE — Assessment & Plan Note (Signed)
He would like to try medication at this time.  We discussed options and will add Lexapro.  Initially started 5 mg with increase to 10 mg after 1 week.  Follow-up in 6 weeks.

## 2022-12-04 NOTE — Assessment & Plan Note (Signed)
This occurs intermittently.  Recent flare is typical what he usually has.  Adding short term Norco back on.

## 2023-03-10 ENCOUNTER — Other Ambulatory Visit: Payer: Self-pay | Admitting: Family Medicine

## 2023-04-16 ENCOUNTER — Other Ambulatory Visit: Payer: Self-pay

## 2023-04-16 DIAGNOSIS — F17210 Nicotine dependence, cigarettes, uncomplicated: Secondary | ICD-10-CM

## 2023-04-16 DIAGNOSIS — M5416 Radiculopathy, lumbar region: Secondary | ICD-10-CM

## 2023-04-16 MED ORDER — MELOXICAM 15 MG PO TABS
15.0000 mg | ORAL_TABLET | Freq: Every day | ORAL | 0 refills | Status: AC | PRN
Start: 2023-04-16 — End: ?

## 2023-04-16 MED ORDER — ESCITALOPRAM OXALATE 10 MG PO TABS: 10.0000 mg | ORAL_TABLET | Freq: Every day | ORAL | 0 refills | Status: DC

## 2023-04-16 MED ORDER — VARENICLINE TARTRATE 0.5 MG PO TABS
0.5000 mg | ORAL_TABLET | Freq: Two times a day (BID) | ORAL | 3 refills | Status: DC
Start: 2023-04-16 — End: 2024-02-13

## 2023-04-22 ENCOUNTER — Ambulatory Visit: Payer: BC Managed Care – PPO | Admitting: Family Medicine

## 2023-04-22 ENCOUNTER — Encounter: Payer: Self-pay | Admitting: Family Medicine

## 2023-04-22 ENCOUNTER — Ambulatory Visit: Payer: BC Managed Care – PPO

## 2023-04-22 VITALS — BP 125/81 | HR 66 | Ht 71.0 in | Wt 173.0 lb

## 2023-04-22 DIAGNOSIS — Z23 Encounter for immunization: Secondary | ICD-10-CM | POA: Diagnosis not present

## 2023-04-22 DIAGNOSIS — F411 Generalized anxiety disorder: Secondary | ICD-10-CM | POA: Diagnosis not present

## 2023-04-22 DIAGNOSIS — R221 Localized swelling, mass and lump, neck: Secondary | ICD-10-CM | POA: Diagnosis not present

## 2023-04-22 DIAGNOSIS — J029 Acute pharyngitis, unspecified: Secondary | ICD-10-CM

## 2023-04-22 DIAGNOSIS — M5416 Radiculopathy, lumbar region: Secondary | ICD-10-CM | POA: Diagnosis not present

## 2023-04-22 DIAGNOSIS — F17210 Nicotine dependence, cigarettes, uncomplicated: Secondary | ICD-10-CM

## 2023-04-22 LAB — POCT RAPID STREP A (OFFICE): Rapid Strep A Screen: NEGATIVE

## 2023-04-22 MED ORDER — ALPRAZOLAM 0.25 MG PO TABS: 0.2500 mg | ORAL_TABLET | Freq: Every evening | ORAL | 1 refills | Status: AC | PRN

## 2023-04-22 MED ORDER — ESCITALOPRAM OXALATE 10 MG PO TABS: 10.0000 mg | ORAL_TABLET | Freq: Every day | ORAL | 3 refills | Status: DC

## 2023-04-22 MED ORDER — MELOXICAM 15 MG PO TABS
15.0000 mg | ORAL_TABLET | Freq: Every day | ORAL | 3 refills | Status: AC | PRN
Start: 2023-04-22 — End: ?

## 2023-04-22 NOTE — Assessment & Plan Note (Signed)
Overall lexapro seems to be helping.  Still with a lot of anxiety at night with some jaw clenching.  Continue lexapro at current strength. Adding alprazolam as needed at night for anxiety and jaw clenching.

## 2023-04-22 NOTE — Assessment & Plan Note (Signed)
Recently restarted chantix.  Encouraged smoking cessation.

## 2023-04-22 NOTE — Progress Notes (Signed)
Manuel Weaver - 42 y.o. male MRN [id number]  Date of birth: 1981/05/20  Subjective Chief Complaint  Patient presents with   Medication Refill   Callouses   Sore Throat    HPI Manuel Weaver is a 42 y.o. male here today for follow up visit.   Continues on lexapro for management of anxiety.  He feels that this is working pretty well for him during the day.  Having anxiety at night causing him to clench his jaw.  Having some jaw pain  He is not experiencing side effects with this.  Continues to have some difficulty quitting smoking.  He has had some success with nicotine replacement.    Using meloxicam as need for low back pain.  Occasional flares that he uses norco for but this is pretty rare.    Has noted nodule in L side of neck.  Family had strep a few weeks ago and that is when he noticed this issue initially.  Has some pain in the area with swallowing.  Hasn't really changed in size.   ROS:  A comprehensive ROS was completed and negative except as noted per HPI  No Known Allergies  Past Medical History:  Diagnosis Date   History of COVID-19 07/22/2020    History reviewed. No pertinent surgical history.  Social History   Socioeconomic History   Marital status: Married    Spouse name: Not on file   Number of children: 1   Years of education: Not on file   Highest education level: Not on file  Occupational History   Not on file  Tobacco Use   Smoking status: Every Day    Current packs/day: 0.75    Average packs/day: 0.8 packs/day for 15.0 years (11.3 ttl pk-yrs)    Types: Cigarettes   Smokeless tobacco: Never  Vaping Use   Vaping status: Never Used  Substance and Sexual Activity   Alcohol use: Yes    Comment: socially   Drug use: Never   Sexual activity: Yes    Partners: Female  Other Topics Concern   Not on file  Social History Narrative   Not on file   Social Determinants of Health   Financial Resource Strain: Not on file  Food  Insecurity: Not on file  Transportation Needs: Not on file  Physical Activity: Not on file  Stress: Not on file  Social Connections: Not on file    Family History  Problem Relation Age of Onset   Skin cancer Mother    Hypertension Father    Stroke Maternal Grandfather     Health Maintenance  Topic Date Due   HIV Screening  Never done   Hepatitis C Screening  Never done   COVID-19 Vaccine (3 - 2023-24 season) 03/23/2023   DTaP/Tdap/Td (2 - Td or Tdap) 12/21/2027   INFLUENZA VACCINE  Completed   HPV VACCINES  Aged Out     ----------------------------------------------------------------------------------------------------------------------------------------------------------------------------------------------------------------- Physical Exam BP 125/81 (BP Location: Left Arm, Patient Position: Sitting, Cuff Size: Normal)   Pulse 66   Ht 5\' 11"  (1.803 m)   Wt 173 lb (78.5 kg)   SpO2 100%   BMI 24.13 kg/m   Physical Exam Constitutional:      Appearance: Normal appearance.  Eyes:     General: No scleral icterus. Neck:     Comments: Small nodule L submandibular area.  Cardiovascular:     Rate and Rhythm: Normal rate and regular rhythm.  Pulmonary:     Effort: Pulmonary effort  is normal.     Breath sounds: Normal breath sounds.  Musculoskeletal:     Cervical back: Neck supple.  Neurological:     Mental Status: He is alert.  Psychiatric:        Mood and Affect: Mood normal.        Behavior: Behavior normal.     ------------------------------------------------------------------------------------------------------------------------------------------------------------------------------------------------------------------- Assessment and Plan  Nodule of neck Strep negative.  [place] of neck ordered.   GAD (generalized anxiety disorder) Overall lexapro seems to be helping.  Still with a lot of anxiety at night with some jaw clenching.  Continue lexapro at current  strength. Adding alprazolam as needed at night for anxiety and jaw clenching.    Nicotine dependence Recently restarted chantix.  Encouraged smoking cessation.   Left lumbar radiculitis Occasional flares.  Meloxicam as needed.    Meds ordered this encounter  Medications   escitalopram (LEXAPRO) 10 MG tablet    Sig: Take 1 tablet (10 mg total) by mouth daily.    Dispense:  90 tablet    Refill:  3   meloxicam (MOBIC) 15 MG tablet    Sig: Take 1 tablet (15 mg total) by mouth daily as needed for pain.    Dispense:  90 tablet    Refill:  3   ALPRAZolam (XANAX) 0.25 MG tablet    Sig: Take 1 tablet (0.25 mg total) by mouth at bedtime as needed for anxiety (Use sparingly for severe anxiety/jaw clenching).    Dispense:  30 tablet    Refill:  1    No follow-ups on file.    This visit occurred during the SARS-CoV-2 public health emergency.  Safety protocols were in place, including screening questions prior to the visit, additional usage of staff PPE, and extensive cleaning of exam room while observing appropriate contact time as indicated for disinfecting solutions.

## 2023-04-22 NOTE — Assessment & Plan Note (Signed)
Strep negative.  [place] of neck ordered.

## 2023-04-22 NOTE — Assessment & Plan Note (Signed)
Occasional flares.  Meloxicam as needed.

## 2023-06-23 ENCOUNTER — Telehealth: Payer: BC Managed Care – PPO | Admitting: Physician Assistant

## 2023-06-23 ENCOUNTER — Encounter: Payer: Self-pay | Admitting: Family Medicine

## 2023-06-23 DIAGNOSIS — R112 Nausea with vomiting, unspecified: Secondary | ICD-10-CM

## 2023-06-23 MED ORDER — ONDANSETRON 4 MG PO TBDP: 4.0000 mg | ORAL_TABLET | Freq: Three times a day (TID) | ORAL | 0 refills | Status: DC | PRN

## 2023-06-23 NOTE — Progress Notes (Signed)

## 2023-06-24 ENCOUNTER — Telehealth: Payer: Self-pay

## 2023-06-24 MED ORDER — DIFICID 200 MG PO TABS: 200.0000 mg | ORAL_TABLET | Freq: Two times a day (BID) | ORAL | 0 refills | Status: AC

## 2023-06-24 NOTE — Telephone Encounter (Signed)
Copied from CRM [id number]. Topic: Clinical - Medication Question >> Jun 23, 2023  1:39 PM Deaijah H wrote: Reason for CRM:   Patient called in wanting to know if Dr. Ashley Royalty could send over antibiotics due to being exposed to C.Difficile Coxin A/B and/or a virtual visit sooner than Dec. 4

## 2023-06-24 NOTE — Telephone Encounter (Signed)
Task completed. Patient has been notified to stop by the pharmacy to pick up the antibiotic rx.

## 2023-10-16 ENCOUNTER — Telehealth: Payer: Self-pay | Admitting: Nurse Practitioner

## 2023-10-16 DIAGNOSIS — J189 Pneumonia, unspecified organism: Secondary | ICD-10-CM

## 2023-10-16 DIAGNOSIS — Z2089 Contact with and (suspected) exposure to other communicable diseases: Secondary | ICD-10-CM

## 2023-10-16 MED ORDER — AZITHROMYCIN 250 MG PO TABS
ORAL_TABLET | ORAL | 0 refills | Status: AC
Start: 2023-10-16 — End: 2023-10-21

## 2023-10-16 NOTE — Progress Notes (Signed)
 Virtual Visit Consent   Manuel Weaver, you are scheduled for a virtual visit with a [facility] provider today. Just as with appointments in the office, your consent must be obtained to participate. Your consent will be active for this visit and any virtual visit you may have with one of our providers in the next 365 days. If you have a MyChart account, a copy of this consent can be sent to you electronically.  As this is a virtual visit, video technology does not allow for your provider to perform a traditional examination. This may limit your provider's ability to fully assess your condition. If your provider identifies any concerns that need to be evaluated in person or the need to arrange testing (such as labs, EKG, etc.), we will make arrangements to do so. Although advances in technology are sophisticated, we cannot ensure that it will always work on either your end or our end. If the connection with a video visit is poor, the visit may have to be switched to a telephone visit. With either a video or telephone visit, we are not always able to ensure that we have a secure connection.  By engaging in this virtual visit, you consent to the provision of healthcare and authorize for your insurance to be billed (if applicable) for the services provided during this visit. Depending on your insurance coverage, you may receive a charge related to this service.  I need to obtain your verbal consent now. Are you willing to proceed with your visit today? Manuel Weaver has provided verbal consent on 10/16/2023 for a virtual visit (video or telephone). Manuel Weaver  Date: 10/16/2023 7:33 PM  Unable to get microphone or video to work over [employer]- converted to telephone apt.   Virtual Visit via Video Note   I, Manuel Weaver, connected with  Manuel Weaver  ([id number], 1981/04/24) on 10/16/23 at  7:45 PM EDT by a video-enabled telemedicine application and verified that I am  speaking with the correct person using two identifiers.  Location: Patient: Virtual Visit Location Patient: Home Provider: Virtual Visit Location Provider: Home Office   I discussed the limitations of evaluation and management by telemedicine and the availability of in person appointments. The patient expressed understanding and agreed to proceed.    History of Present Illness: Manuel Weaver is a 43 y.o. who identifies as a male who was assigned male at birth, and is being seen today for concerns about exposure to pneumonia.   His wife and 88 year old son have been diagnosed with "walking pneumonia"  Both family members are being treated with azithromycin   Patient is having a cough  He hears a rattle with the cough   He was sick with URI symptoms about 1.5 weeks ago and has a lingering cough  Denies a history of asthma or need for inhalers   Symptoms started with a fever 2 weeks ago  No testing for COVID   Cough is wet   He has been able to eat and drink  Problems:  Patient Active Problem List   Diagnosis Date Noted   Nodule of neck 04/22/2023   Right foot pain 12/07/2021   Back pain 12/07/2021   Well adult exam 11/15/2020   Allergic rhinitis 10/29/2020   GAD (generalized anxiety disorder) 10/29/2020   Nicotine dependence 10/29/2020   Left lumbar radiculitis 10/11/2019    Allergies: No Known Allergies Medications:  Current Outpatient Medications:    ALPRAZolam (XANAX) 0.25 MG  tablet, Take 1 tablet (0.25 mg total) by mouth at bedtime as needed for anxiety (Use sparingly for severe anxiety/jaw clenching)., Disp: 30 tablet, Rfl: 1   escitalopram (LEXAPRO) 10 MG tablet, Take 1 tablet (10 mg total) by mouth daily., Disp: 90 tablet, Rfl: 3   HYDROcodone-acetaminophen (NORCO) 10-325 MG tablet, Take 1 tablet by mouth every 8 (eight) hours as needed., Disp: 15 tablet, Rfl: 0   meloxicam (MOBIC) 15 MG tablet, Take 1 tablet (15 mg total) by mouth daily as needed for pain.,  Disp: 90 tablet, Rfl: 3   nicotine (NICODERM CQ) 14 mg/24hr patch, Place 1 patch (14 mg total) onto the skin daily. Use x14 days then decrease to 7mg  patch, Disp: 14 patch, Rfl: 0   nicotine (NICODERM CQ) 21 mg/24hr patch, Place 1 patch (21 mg total) onto the skin daily. Use x14 days then decrease to 14mg  patch, Disp: 14 patch, Rfl: 0   nicotine (NICODERM CQ) 7 mg/24hr patch, Place 1 patch (7 mg total) onto the skin daily., Disp: 28 patch, Rfl: 0   ondansetron (ZOFRAN-ODT) 4 MG disintegrating tablet, Take 1 tablet (4 mg total) by mouth every 8 (eight) hours as needed., Disp: 20 tablet, Rfl: 0   varenicline (CHANTIX) 0.5 MG tablet, Take 1 tablet (0.5 mg total) by mouth 2 (two) times daily., Disp: 60 tablet, Rfl: 3  Observations/Objective:  No labored breathing.  Speech is clear and coherent with logical content.  Patient is alert and oriented at baseline.    Assessment and Plan:  1. Community acquired pneumonia, unspecified laterality  2. Exposure to pneumonia  Meds ordered this encounter  Medications   azithromycin (ZITHROMAX) 250 MG tablet    Sig: Take 2 tablets on day 1, then 1 tablet daily on days 2 through 5    Dispense:  6 tablet    Refill:  0     Follow Up Instructions: I discussed the assessment and treatment plan with the patient. The patient was provided an opportunity to ask questions and all were answered. The patient agreed with the plan and demonstrated an understanding of the instructions.  A copy of instructions were sent to the patient via MyChart unless otherwise noted below.    The patient was advised to call back or seek an in-person evaluation if the symptoms worsen or if the condition fails to improve as anticipated.    Manuel Weaver

## 2023-11-12 ENCOUNTER — Other Ambulatory Visit: Payer: Self-pay | Admitting: Family Medicine

## 2024-01-26 ENCOUNTER — Ambulatory Visit: Payer: Self-pay

## 2024-01-26 NOTE — Telephone Encounter (Signed)
 FYI Only or Action Required?: FYI only for provider.  Patient was last seen in primary care on 10/16/2023 by Kennyth Domino, FNP. Called Nurse Triage reporting Otalgia. Symptoms began a week ago. Interventions attempted: Rest, hydration, or home remedies. Symptoms are: gradually worsening.  Triage Disposition: See Physician Within 24 Hours  Patient/caregiver understands and will follow disposition?: Yes  Copied from CRM [id number]. Topic: Clinical - Red Word Triage >> Jan 26, 2024 11:50 AM Susanna ORN wrote: Red Word that prompted transfer to Nurse Triage: Patient states he thinks he may have ear infections in both ears. States they are aching & throbbing. Anytime he gets into the pool, they hurt really bad. Wants to see Dr. Alvia. Reason for Disposition  Earache  (Exceptions: brief ear pain of < 60 minutes duration, earache occurring during air travel  Answer Assessment - Initial Assessment Questions 1. LOCATION: Which ear is involved?     Left 2 months, right couple days  2. ONSET: When did the ear start hurting      Couple days  3. SEVERITY: How bad is the pain?  (Scale 1-10; mild, moderate or severe)   - MILD (1-3): doesn't interfere with normal activities    - MODERATE (4-7): interferes with normal activities or awakens from sleep    - SEVERE (8-10): excruciating pain, unable to do any normal activities      9/10 4. URI SYMPTOMS: Do you have a runny nose or cough?     Congested  5. FEVER: Do you have a fever? If Yes, ask: What is your temperature, how was it measured, and when did it start?     Denies  6. CAUSE: Have you been swimming recently?, How often do you use Q-TIPS?, Have you had any recent air travel or scuba diving?     Swimming make pain worse. Uses q-tips 7. OTHER SYMPTOMS: Do you have any other symptoms? (e.g., headache, stiff neck, dizziness, vomiting, runny nose, decreased hearing)     Denies  Protocols used: Earache-A-AH

## 2024-01-27 ENCOUNTER — Ambulatory Visit (INDEPENDENT_AMBULATORY_CARE_PROVIDER_SITE_OTHER): Admitting: Family Medicine

## 2024-01-27 ENCOUNTER — Encounter: Payer: Self-pay | Admitting: Family Medicine

## 2024-01-27 VITALS — BP 117/79 | HR 61 | Ht 71.0 in | Wt 182.0 lb

## 2024-01-27 DIAGNOSIS — H6690 Otitis media, unspecified, unspecified ear: Secondary | ICD-10-CM | POA: Diagnosis not present

## 2024-01-27 DIAGNOSIS — M5416 Radiculopathy, lumbar region: Secondary | ICD-10-CM

## 2024-01-27 DIAGNOSIS — H6090 Unspecified otitis externa, unspecified ear: Secondary | ICD-10-CM | POA: Insufficient documentation

## 2024-01-27 DIAGNOSIS — H60331 Swimmer's ear, right ear: Secondary | ICD-10-CM | POA: Diagnosis not present

## 2024-01-27 MED ORDER — OFLOXACIN 0.3 % OT SOLN: 10.0000 [drp] | Freq: Every day | OTIC | 0 refills | Status: AC

## 2024-01-27 MED ORDER — HYDROCODONE-ACETAMINOPHEN 10-325 MG PO TABS: 1.0000 | ORAL_TABLET | Freq: Three times a day (TID) | ORAL | 0 refills | Status: AC | PRN

## 2024-01-27 MED ORDER — OFLOXACIN 0.3 % OT SOLN
10.0000 [drp] | Freq: Every day | OTIC | 0 refills | Status: DC
Start: 2024-01-27 — End: 2024-01-27

## 2024-01-27 MED ORDER — AMOXICILLIN-POT CLAVULANATE 875-125 MG PO TABS: 1.0000 | ORAL_TABLET | Freq: Two times a day (BID) | ORAL | 0 refills | Status: DC

## 2024-01-27 MED ORDER — HYDROCODONE-ACETAMINOPHEN 10-325 MG PO TABS: 1.0000 | ORAL_TABLET | Freq: Three times a day (TID) | ORAL | 0 refills | Status: DC | PRN

## 2024-01-27 NOTE — Assessment & Plan Note (Signed)
 Intermittent flares.  Using norco prn.  Rx renewed.

## 2024-01-27 NOTE — Assessment & Plan Note (Signed)
 Acute OM of L ear.  Treating with augmentin .  Recheck in 2 weeks to be sure this is resolved.

## 2024-01-27 NOTE — Patient Instructions (Signed)
 Try asetpro nasal spray daily.   Use antibiotics as directed.  See me again in 2 weeks.

## 2024-01-27 NOTE — Assessment & Plan Note (Signed)
 Treating with ofloxacin  drops.  Return in 2 weeks to be sure this has resolved.

## 2024-01-27 NOTE — Progress Notes (Signed)
 Manuel Weaver - 43 y.o. male MRN [id number]  Date of birth: Aug 01, 1980  Subjective Chief Complaint  Patient presents with   Otalgia    HPI Manuel Weaver is a 43 y.o. male here today with complaint of ear pain.  Pain started a week or two ago.  Noticed initially while swimming. Pain is worse in the R ear but also has pain in the L ear..  Reports that pain is worse when water contacts the ear.  He tried alcohol in the ear canal after swimming and this was very painful.  Denies drainage from the ear, fever, chills or headaches.  He has not noticed significant changes to hearing. He does admit to chronic nasal congestion.   ROS:  A comprehensive ROS was completed and negative except as noted per HPI  No Known Allergies  Past Medical History:  Diagnosis Date   History of COVID-19 07/22/2020    History reviewed. No pertinent surgical history.  Social History   Socioeconomic History   Marital status: Married    Spouse name: Not on file   Number of children: 1   Years of education: Not on file   Highest education level: Not on file  Occupational History   Not on file  Tobacco Use   Smoking status: Every Day    Current packs/day: 0.75    Average packs/day: 0.8 packs/day for 15.0 years (11.3 ttl pk-yrs)    Types: Cigarettes   Smokeless tobacco: Never  Vaping Use   Vaping status: Never Used  Substance and Sexual Activity   Alcohol use: Yes    Comment: socially   Drug use: Never   Sexual activity: Yes    Partners: Female  Other Topics Concern   Not on file  Social History Narrative   Not on file   Social Drivers of Health   Financial Resource Strain: Not on file  Food Insecurity: Not on file  Transportation Needs: Not on file  Physical Activity: Not on file  Stress: Not on file  Social Connections: Not on file    Family History  Problem Relation Age of Onset   Skin cancer Mother    Hypertension Father    Stroke Maternal Grandfather     Health  Maintenance  Topic Date Due   HIV Screening  Never done   Hepatitis C Screening  Never done   Pneumococcal Vaccine 50-17 Years old (1 of 2 - PCV) Never done   Hepatitis B Vaccines (1 of 3 - 19+ 3-dose series) Never done   HPV VACCINES (1 - 3-dose SCDM series) Never done   COVID-19 Vaccine (3 - 2024-25 season) 03/23/2023   INFLUENZA VACCINE  02/20/2024   DTaP/Tdap/Td (2 - Td or Tdap) 12/21/2027   Meningococcal B Vaccine  Aged Out     ----------------------------------------------------------------------------------------------------------------------------------------------------------------------------------------------------------------- Physical Exam BP 117/79 (BP Location: Left Arm, Patient Position: Sitting, Cuff Size: Normal)   Pulse 61   Ht 5' 11 (1.803 m)   Wt 182 lb (82.6 kg)   SpO2 98%   BMI 25.38 kg/m   Physical Exam Constitutional:      Appearance: Normal appearance.  HENT:     Ears:     Comments: L TM with erythema and cloudy fluid.   Eyes:     General: No scleral icterus. Pulmonary:     Effort: Pulmonary effort is normal.     Breath sounds: Normal breath sounds.  Neurological:     General: No focal deficit present.  Mental Status: He is alert.  Psychiatric:        Mood and Affect: Mood normal.        Behavior: Behavior normal.     ------------------------------------------------------------------------------------------------------------------------------------------------------------------------------------------------------------------- Assessment and Plan  Left lumbar radiculitis Intermittent flares.  Using norco prn.  Rx renewed.   Otitis externa Treating with ofloxacin  drops.  Return in 2 weeks to be sure this has resolved.   Otitis media Acute OM of L ear.  Treating with augmentin .  Recheck in 2 weeks to be sure this is resolved.    Meds ordered this encounter  Medications   DISCONTD: ofloxacin  (FLOXIN ) 0.3 % OTIC solution    Sig:  Place 10 drops into the right ear daily for 7 days. For 7 days.    Dispense:  10 mL    Refill:  0   DISCONTD: amoxicillin -clavulanate (AUGMENTIN ) 875-125 MG tablet    Sig: Take 1 tablet by mouth 2 (two) times daily.    Dispense:  20 tablet    Refill:  0   DISCONTD: HYDROcodone -acetaminophen  (NORCO) 10-325 MG tablet    Sig: Take 1 tablet by mouth every 8 (eight) hours as needed.    Dispense:  15 tablet    Refill:  0   amoxicillin -clavulanate (AUGMENTIN ) 875-125 MG tablet    Sig: Take 1 tablet by mouth 2 (two) times daily.    Dispense:  20 tablet    Refill:  0   HYDROcodone -acetaminophen  (NORCO) 10-325 MG tablet    Sig: Take 1 tablet by mouth every 8 (eight) hours as needed.    Dispense:  15 tablet    Refill:  0   ofloxacin  (FLOXIN ) 0.3 % OTIC solution    Sig: Place 10 drops into the right ear daily for 7 days. For 7 days.    Dispense:  10 mL    Refill:  0    Return in about 2 weeks (around 02/10/2024) for Recheck ear.

## 2024-02-13 ENCOUNTER — Ambulatory Visit (INDEPENDENT_AMBULATORY_CARE_PROVIDER_SITE_OTHER): Admitting: Family Medicine

## 2024-02-13 ENCOUNTER — Encounter: Payer: Self-pay | Admitting: Family Medicine

## 2024-02-13 VITALS — BP 121/82 | HR 61 | Ht 71.0 in | Wt 180.0 lb

## 2024-02-13 DIAGNOSIS — F17210 Nicotine dependence, cigarettes, uncomplicated: Secondary | ICD-10-CM

## 2024-02-13 DIAGNOSIS — H7290 Unspecified perforation of tympanic membrane, unspecified ear: Secondary | ICD-10-CM | POA: Diagnosis not present

## 2024-02-13 MED ORDER — NICOTINE 21 MG/24HR TD PT24: MEDICATED_PATCH | TRANSDERMAL | 0 refills | Status: AC

## 2024-02-13 MED ORDER — CIPROFLOXACIN HCL 0.2 % OT SOLN: 0.2000 mL | Freq: Two times a day (BID) | OTIC | 0 refills | Status: DC

## 2024-02-13 MED ORDER — NICOTINE 7 MG/24HR TD PT24: MEDICATED_PATCH | TRANSDERMAL | 0 refills | Status: AC

## 2024-02-13 MED ORDER — NICOTINE 14 MG/24HR TD PT24: MEDICATED_PATCH | TRANSDERMAL | 0 refills | Status: AC

## 2024-02-13 NOTE — Patient Instructions (Addendum)
 No swimming or diving  Avoid getting water in the affected ear when bathing or showering (ie, use a cotton ball coated with petroleum jelly in the ear to create a barrier)  Recommend adding flonase daily to help with eustachian tube dysfunction.

## 2024-02-13 NOTE — Assessment & Plan Note (Signed)
 Will restart patches again.

## 2024-02-13 NOTE — Assessment & Plan Note (Signed)
 OE of R ear has resolved but appears to have small perforation of TM.  Adding cipro drops and will place referral to ENT.

## 2024-02-13 NOTE — Progress Notes (Signed)
 Manuel Weaver - 43 y.o. male MRN [id number]  Date of birth: 1980-09-18  Subjective Chief Complaint  Patient presents with   Ear Pain    HPI Manuel Weaver is a 43 y.o. male here today for follow up of ear pain.  Continues to have throbbing sensation in R ear.  TM was not completely visualized on R ear during last visit due to swelling related to OE.  He has completed course of Ofloxacin  drops and has improvement of pain in ear canal but still feels throbbing sensation and notes some mild ringing in the R ear.  Pain in L ear has resolved.  He has noted some occasional drainage from the R ear.  Denies fever or chills.   He continues to struggle with quitting smoking.  Did not tolerate Chantix  very well.  He would like to try patches again.   ROS:  A comprehensive ROS was completed and negative except as noted per HPI  No Known Allergies  Past Medical History:  Diagnosis Date   History of COVID-19 07/22/2020    No past surgical history on file.  Social History   Socioeconomic History   Marital status: Married    Spouse name: Not on file   Number of children: 1   Years of education: Not on file   Highest education level: Not on file  Occupational History   Not on file  Tobacco Use   Smoking status: Every Day    Current packs/day: 0.75    Average packs/day: 0.8 packs/day for 15.0 years (11.3 ttl pk-yrs)    Types: Cigarettes   Smokeless tobacco: Never  Vaping Use   Vaping status: Never Used  Substance and Sexual Activity   Alcohol use: Yes    Comment: socially   Drug use: Never   Sexual activity: Yes    Partners: Female  Other Topics Concern   Not on file  Social History Narrative   Not on file   Social Drivers of Health   Financial Resource Strain: Low Risk  (02/13/2024)   Overall Financial Resource Strain (CARDIA)    Difficulty of Paying Living Expenses: Not hard at all  Food Insecurity: No Food Insecurity (02/13/2024)   Hunger Vital Sign     Worried About Running Out of Food in the Last Year: Never true    Ran Out of Food in the Last Year: Never true  Transportation Needs: No Transportation Needs (02/13/2024)   PRAPARE - Administrator, Civil Service (Medical): No    Lack of Transportation (Non-Medical): No  Physical Activity: Sufficiently Active (02/13/2024)   Exercise Vital Sign    Days of Exercise per Week: 3 days    Minutes of Exercise per Session: 60 min  Stress: Stress Concern Present (02/13/2024)   [employer] of Occupational Health - Occupational Stress Questionnaire    Feeling of Stress: To some extent  Social Connections: Moderately Integrated (02/13/2024)   Social Connection and Isolation Panel    Frequency of Communication with Friends and Family: More than three times a week    Frequency of Social Gatherings with Friends and Family: Once a week    Attends Religious Services: Patient declined    Database administrator or Organizations: Yes    Attends Banker Meetings: 1 to 4 times per year    Marital Status: Married    Family History  Problem Relation Age of Onset   Skin cancer Mother    Hypertension Father  Stroke Maternal Grandfather     Health Maintenance  Topic Date Due   HIV Screening  Never done   Hepatitis C Screening  Never done   Pneumococcal Vaccine 54-60 Years old (1 of 2 - PCV) Never done   Hepatitis B Vaccines (1 of 3 - 19+ 3-dose series) Never done   HPV VACCINES (1 - 3-dose SCDM series) Never done   COVID-19 Vaccine (3 - 2024-25 season) 03/23/2023   INFLUENZA VACCINE  02/20/2024   DTaP/Tdap/Td (2 - Td or Tdap) 12/21/2027   Meningococcal B Vaccine  Aged Out     ----------------------------------------------------------------------------------------------------------------------------------------------------------------------------------------------------------------- Physical Exam BP 121/82 (BP Location: Left Arm, Patient Position: Sitting, Cuff Size:  Normal)   Pulse 61   Ht 5' 11 (1.803 m)   Wt 180 lb (81.6 kg)   SpO2 98%   BMI 25.10 kg/m   Physical Exam Constitutional:      Appearance: Normal appearance.  HENT:     Head: Normocephalic and atraumatic.     Ears:     Comments: Small perforation of R TM.  Canal normal.  L TM normal.  Eyes:     General: No scleral icterus. Cardiovascular:     Rate and Rhythm: Normal rate and regular rhythm.  Pulmonary:     Effort: Pulmonary effort is normal.     Breath sounds: Normal breath sounds.  Neurological:     Mental Status: He is alert.  Psychiatric:        Mood and Affect: Mood normal.        Behavior: Behavior normal.     ------------------------------------------------------------------------------------------------------------------------------------------------------------------------------------------------------------------- Assessment and Plan  Nicotine  dependence Will restart patches again.   Perforation of tympanic membrane in adult OE of R ear has resolved but appears to have small perforation of TM.  Adding cipro drops and will place referral to ENT.    Meds ordered this encounter  Medications   Ciprofloxacin HCl 0.2 % otic solution    Sig: Place 0.2 mLs into the right ear 2 (two) times daily for 10 days.    Dispense:  20 each    Refill:  0   nicotine  (NICODERM CQ  - DOSED IN MG/24 HOURS) 14 mg/24hr patch    Sig: UNWRAP AND APPLY ONE PATCH ONTO SKIN DAILY. USE FOR 14 DAYS THEN DECREASE TO 7MG     Dispense:  14 patch    Refill:  0   nicotine  (NICODERM CQ  - DOSED IN MG/24 HOURS) 21 mg/24hr patch    Sig: PLACE ONE PATCH ONTO SKIN DAILY FOR 14 DAYS, THEN DECREASE TO 14 MG PATCH    Dispense:  14 patch    Refill:  0   nicotine  (NICODERM CQ  - DOSED IN MG/24 HR) 7 mg/24hr patch    Sig: UNWRAP AND APPLY ONE PATCH ONTO THE SKIN DAILY    Dispense:  28 patch    Refill:  0    Return in about 3 weeks (around 03/05/2024) for F/u TM per.SABRA

## 2024-02-18 ENCOUNTER — Encounter: Payer: Self-pay | Admitting: Family Medicine

## 2024-02-18 MED ORDER — OFLOXACIN 0.3 % OT SOLN: 10.0000 [drp] | Freq: Every day | OTIC | 0 refills | Status: AC

## 2024-02-20 ENCOUNTER — Encounter (INDEPENDENT_AMBULATORY_CARE_PROVIDER_SITE_OTHER): Payer: Self-pay

## 2024-03-05 ENCOUNTER — Encounter: Payer: Self-pay | Admitting: Family Medicine

## 2024-03-05 ENCOUNTER — Ambulatory Visit: Admitting: Family Medicine

## 2024-03-05 VITALS — BP 131/90 | HR 62 | Ht 71.0 in | Wt 179.1 lb

## 2024-03-05 DIAGNOSIS — F411 Generalized anxiety disorder: Secondary | ICD-10-CM | POA: Diagnosis not present

## 2024-03-05 DIAGNOSIS — H7290 Unspecified perforation of tympanic membrane, unspecified ear: Secondary | ICD-10-CM | POA: Diagnosis not present

## 2024-03-05 DIAGNOSIS — H7291 Unspecified perforation of tympanic membrane, right ear: Secondary | ICD-10-CM

## 2024-03-05 MED ORDER — ESCITALOPRAM OXALATE 10 MG PO TABS: 15.0000 mg | ORAL_TABLET | Freq: Every day | ORAL | 3 refills | Status: AC

## 2024-03-05 NOTE — Assessment & Plan Note (Signed)
 Increased anxiety.  Will increased lexaprot 15mg  daily.

## 2024-03-05 NOTE — Progress Notes (Signed)
 Manuel Weaver - 43 y.o. male MRN [id number]  Date of birth: [birth year]  Subjective Chief Complaint  Patient presents with   Follow-up    HPI Manuel Weaver is a 43 y.o. male here today for follow up visit.    He has had B/L OM with TM perforation on R.  Has been on PO antibiotics and drops.  Pain improved but continues to have diminished hearing on the R.  Denies drainage from the ear. He has not had fever or chills.  He still has pressure in the L ear as well.  He is using astepro intermittently.  He continues to smoke regularly.   He also notes increased anxiety.  More stress at work and anticipates this to continue throughout the rest of the year.  He has had improvement with lexapro  and would like to try increasing this.   ROS:  A comprehensive ROS was completed and negative except as noted per HPI  No Known Allergies  Past Medical History:  Diagnosis Date   History of COVID-19 07/22/2020    No past surgical history on file.  Social History   Socioeconomic History   Marital status: Married    Spouse name: Not on file   Number of children: 1   Years of education: Not on file   Highest education level: Not on file  Occupational History   Not on file  Tobacco Use   Smoking status: Every Day    Current packs/day: 0.75    Average packs/day: 0.8 packs/day for 15.0 years (11.3 ttl pk-yrs)    Types: Cigarettes   Smokeless tobacco: Never  Vaping Use   Vaping status: Never Used  Substance and Sexual Activity   Alcohol use: Yes    Comment: socially   Drug use: Never   Sexual activity: Yes    Partners: Female  Other Topics Concern   Not on file  Social History Narrative   Not on file   Social Drivers of Health   Financial Resource Strain: Low Risk  (02/13/2024)   Overall Financial Resource Strain (CARDIA)    Difficulty of Paying Living Expenses: Not hard at all  Food Insecurity: No Food Insecurity (02/13/2024)   Hunger Vital Sign    Worried About  Running Out of Food in the Last Year: Never true    Ran Out of Food in the Last Year: Never true  Transportation Needs: No Transportation Needs (02/13/2024)   PRAPARE - Administrator, Civil Service (Medical): No    Lack of Transportation (Non-Medical): No  Physical Activity: Sufficiently Active (02/13/2024)   Exercise Vital Sign    Days of Exercise per Week: 3 days    Minutes of Exercise per Session: 60 min  Stress: Stress Concern Present (02/13/2024)   [employer] of Occupational Health - Occupational Stress Questionnaire    Feeling of Stress: To some extent  Social Connections: Moderately Integrated (02/13/2024)   Social Connection and Isolation Panel    Frequency of Communication with Friends and Family: More than three times a week    Frequency of Social Gatherings with Friends and Family: Once a week    Attends Religious Services: Patient declined    Database administrator or Organizations: Yes    Attends Banker Meetings: 1 to 4 times per year    Marital Status: Married    Family History  Problem Relation Age of Onset   Skin cancer Mother    Hypertension Father  Stroke Maternal Grandfather     Health Maintenance  Topic Date Due   HIV Screening  Never done   Hepatitis C Screening  Never done   Pneumococcal Vaccine (1 of 2 - PCV) Never done   Hepatitis B Vaccines 19-59 Average Risk (1 of 3 - 19+ 3-dose series) Never done   HPV VACCINES (1 - 3-dose SCDM series) Never done   COVID-19 Vaccine (3 - 2024-25 season) 03/23/2023   INFLUENZA VACCINE  02/20/2024   DTaP/Tdap/Td (2 - Td or Tdap) 12/21/2027   Meningococcal B Vaccine  Aged Out     ----------------------------------------------------------------------------------------------------------------------------------------------------------------------------------------------------------------- Physical Exam BP (!) 131/90   Pulse 62   Ht 5' 11 (1.803 m)   Wt 179 lb 1.9 oz (81.2 kg)   SpO2  99%   BMI 24.98 kg/m   Physical Exam Constitutional:      Appearance: Normal appearance.  HENT:     Head: Normocephalic and atraumatic.  Cardiovascular:     Rate and Rhythm: Normal rate and regular rhythm.  Pulmonary:     Effort: Pulmonary effort is normal.     Breath sounds: Normal breath sounds.  Musculoskeletal:     Cervical back: Neck supple.  Neurological:     General: No focal deficit present.     Mental Status: He is alert.  Psychiatric:        Mood and Affect: Mood normal.        Behavior: Behavior normal.     ------------------------------------------------------------------------------------------------------------------------------------------------------------------------------------------------------------------- Assessment and Plan  Perforation of tympanic membrane in adult Still with perforation.  No drainage at this time.  Referral placed to ENT.    GAD (generalized anxiety disorder) Increased anxiety.  Will increased lexaprot 15mg  daily.     Meds ordered this encounter  Medications   escitalopram  (LEXAPRO ) 10 MG tablet    Sig: Take 1.5 tablets (15 mg total) by mouth daily.    Dispense:  135 tablet    Refill:  3    No follow-ups on file.

## 2024-03-05 NOTE — Assessment & Plan Note (Signed)
 Still with perforation.  No drainage at this time.  Referral placed to ENT.

## 2024-03-23 ENCOUNTER — Encounter: Payer: Self-pay | Admitting: Sports Medicine
